# Patient Record
Sex: Male | Born: 1975 | Race: White | Hispanic: No | Marital: Married | Smoking: Former smoker
Health system: Southern US, Community
[De-identification: ages and names within clinical notes are randomized; demographics above are authoritative.]

## PROBLEM LIST (undated history)

## (undated) DIAGNOSIS — K219 Gastro-esophageal reflux disease without esophagitis: Secondary | ICD-10-CM

## (undated) DIAGNOSIS — M545 Low back pain, unspecified: Secondary | ICD-10-CM

## (undated) DIAGNOSIS — T7840XA Allergy, unspecified, initial encounter: Secondary | ICD-10-CM

## (undated) HISTORY — PX: WISDOM TOOTH EXTRACTION: SHX21

## (undated) HISTORY — DX: Low back pain, unspecified: M54.50

## (undated) HISTORY — DX: Gastro-esophageal reflux disease without esophagitis: K21.9

## (undated) HISTORY — DX: Allergy, unspecified, initial encounter: T78.40XA

## (undated) HISTORY — DX: Low back pain: M54.5

---

## 2001-08-05 ENCOUNTER — Emergency Department (HOSPITAL_COMMUNITY): Admission: EM | Admit: 2001-08-05 | Discharge: 2001-08-05 | Payer: Self-pay | Admitting: *Deleted

## 2001-08-06 ENCOUNTER — Emergency Department (HOSPITAL_COMMUNITY): Admission: EM | Admit: 2001-08-06 | Discharge: 2001-08-06 | Payer: Self-pay | Admitting: Emergency Medicine

## 2001-08-08 ENCOUNTER — Encounter (HOSPITAL_COMMUNITY): Admission: RE | Admit: 2001-08-08 | Discharge: 2001-09-07 | Payer: Self-pay | Admitting: Orthopaedic Surgery

## 2009-03-16 ENCOUNTER — Ambulatory Visit: Payer: Self-pay | Admitting: Family Medicine

## 2009-06-11 ENCOUNTER — Ambulatory Visit: Payer: Self-pay | Admitting: Family Medicine

## 2010-08-10 ENCOUNTER — Telehealth: Payer: Self-pay | Admitting: Family Medicine

## 2010-08-10 NOTE — Telephone Encounter (Signed)
Need chart

## 2010-08-10 NOTE — Telephone Encounter (Signed)
Faxed refill authorization for omeprazole 20 mg #30 with 0 refills pt needs ov

## 2011-09-05 ENCOUNTER — Ambulatory Visit (INDEPENDENT_AMBULATORY_CARE_PROVIDER_SITE_OTHER): Payer: BC Managed Care – PPO | Admitting: Family Medicine

## 2011-09-05 ENCOUNTER — Encounter: Payer: Self-pay | Admitting: Family Medicine

## 2011-09-05 VITALS — BP 108/70 | HR 64 | Ht 70.0 in | Wt 177.0 lb

## 2011-09-05 DIAGNOSIS — M545 Low back pain, unspecified: Secondary | ICD-10-CM

## 2011-09-05 LAB — POCT URINALYSIS DIPSTICK
Bilirubin, UA: NEGATIVE
Blood, UA: NEGATIVE
Glucose, UA: NEGATIVE
Ketones, UA: NEGATIVE
Spec Grav, UA: 1.015

## 2011-09-05 MED ORDER — CYCLOBENZAPRINE HCL 10 MG PO TABS: 10.0000 mg | ORAL_TABLET | Freq: Three times a day (TID) | ORAL | Status: AC | PRN

## 2011-09-05 MED ORDER — HYDROCODONE-ACETAMINOPHEN 5-500 MG PO TABS: 1.0000 | ORAL_TABLET | Freq: Three times a day (TID) | ORAL | Status: AC | PRN

## 2011-09-05 NOTE — Patient Instructions (Addendum)
Heat/stretches. Continue ibuprofen 800 mg every 8 hours with food.  Restart prilosec if you get any stomach discomfort Use the hydrocodone only for severe pain. Causes sedation. Use the muscle relaxant (flexeril) mainly just at night due to sedation.  Get the x-ray (orders are in computer--go directly to [facility] at the [facility]) if you are having ongoing back pain.
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
## 2012-06-28 ENCOUNTER — Ambulatory Visit (INDEPENDENT_AMBULATORY_CARE_PROVIDER_SITE_OTHER): Payer: BC Managed Care – PPO | Admitting: Family Medicine

## 2012-06-28 ENCOUNTER — Ambulatory Visit: Payer: Self-pay | Admitting: Family Medicine

## 2012-06-28 ENCOUNTER — Encounter: Payer: Self-pay | Admitting: Family Medicine

## 2012-06-28 VITALS — BP 130/90 | HR 72 | Temp 98.3°F | Wt 172.0 lb

## 2012-06-28 DIAGNOSIS — S61219A Laceration without foreign body of unspecified finger without damage to nail, initial encounter: Secondary | ICD-10-CM

## 2012-06-28 DIAGNOSIS — K529 Noninfective gastroenteritis and colitis, unspecified: Secondary | ICD-10-CM

## 2012-06-28 DIAGNOSIS — J069 Acute upper respiratory infection, unspecified: Secondary | ICD-10-CM

## 2012-06-28 DIAGNOSIS — K5289 Other specified noninfective gastroenteritis and colitis: Secondary | ICD-10-CM

## 2012-06-28 DIAGNOSIS — S61209A Unspecified open wound of unspecified finger without damage to nail, initial encounter: Secondary | ICD-10-CM

## 2012-06-28 NOTE — Progress Notes (Signed)
  Subjective:    Patient ID: Evan Frey, male    DOB: March 14, 1976, 37 y.o.   MRN: [id number]  HPI He has multiple concerns today. He has a six-day history of sore throat, nasal congestion, cough,sneezing, fever and chills, PND, fatigue. His cough has become today. He also complains of loose stools 3 or 4 per day for the last 4 days . Someone else on the same trip that he was recently on developed diarrhea after eating the same [place] food .diarrhea is going away on its own.He is using Claritin and NyQuil with some relief of his symptoms. His symptoms are slowly getting better. Approximately 5 days ago he cut his right proximal index finger. He cleaned it and bandaged it and put superglue on it. His here for followup on this.his immunizations are up to date   Review of Systems     Objective:   Physical Exam Alert and in no distress. Tympanic membranes and canals are normal. Throat is clear. Tonsils are normal. Neck is supple without adenopathy or thyromegaly. Cardiac exam shows a regular sinus rhythm without murmurs or gallops. Lungs are clear to auscultation.  down exam not done. The laceration to the right index finger at the bases approximately 2 cm long. No erythema tenderness or warmth is noted. His immunizations are up-to-date      Assessment & Plan:  Acute URI  Acute gastroenteritis  Laceration of finger of right hand, initial encounter since he is getting better from the URI and gastroenteritis, no therapy is necessary. The laceration is also healing well. No further intervention needed.

## 2012-10-12 ENCOUNTER — Encounter: Payer: Self-pay | Admitting: Internal Medicine

## 2012-10-24 ENCOUNTER — Encounter: Payer: Self-pay | Admitting: Medical

## 2012-10-24 ENCOUNTER — Ambulatory Visit (INDEPENDENT_AMBULATORY_CARE_PROVIDER_SITE_OTHER): Payer: BC Managed Care – PPO | Admitting: Medical

## 2012-10-24 VITALS — BP 100/70 | HR 68 | Temp 98.4°F | Resp 16 | Ht 70.0 in | Wt 173.0 lb

## 2012-10-24 DIAGNOSIS — R209 Unspecified disturbances of skin sensation: Secondary | ICD-10-CM

## 2012-10-24 DIAGNOSIS — R002 Palpitations: Secondary | ICD-10-CM

## 2012-10-24 DIAGNOSIS — R202 Paresthesia of skin: Secondary | ICD-10-CM

## 2012-10-24 DIAGNOSIS — R5381 Other malaise: Secondary | ICD-10-CM

## 2012-10-24 DIAGNOSIS — R5383 Other fatigue: Secondary | ICD-10-CM

## 2012-10-24 DIAGNOSIS — Z0000 Encounter for general adult medical examination without abnormal findings: Secondary | ICD-10-CM

## 2012-10-24 LAB — CBC WITH DIFFERENTIAL/PLATELET
Basophils Relative: 0 % (ref 0–1)
Eosinophils Absolute: 0.1 10*3/uL (ref 0.0–0.7)
Lymphs Abs: 2.4 10*3/uL (ref 0.7–4.0)
MCH: 30.8 pg (ref 26.0–34.0)
MCHC: 35 g/dL (ref 30.0–36.0)
Neutrophils Relative %: 54 % (ref 43–77)
Platelets: 221 10*3/uL (ref 150–400)
RBC: 4.96 MIL/uL (ref 4.22–5.81)

## 2012-10-24 LAB — POCT URINALYSIS DIPSTICK
Bilirubin, UA: NEGATIVE
Glucose, UA: NEGATIVE
Ketones, UA: NEGATIVE
Spec Grav, UA: 1.015
Urobilinogen, UA: NEGATIVE

## 2012-10-24 MED ORDER — OMEPRAZOLE MAGNESIUM 20 MG PO TBEC: 20.0000 mg | DELAYED_RELEASE_TABLET | Freq: Every day | ORAL | Status: DC | PRN

## 2012-10-24 NOTE — Progress Notes (Signed)
Subjective:   HPI  Evan Frey is a 37 y.o. male who presents for a complete physical.  Medical care team includes:  Chiropractor, Dr. Ladona Ridgel in [place]  Dentist - Dr. Yetta Barre  Primary care here, [facility]   Preventative care: Last ophthalmology visit: n/a Last dental visit:yes- Dr. Yetta Barre Last colonoscopy:n/a Last prostate exam: n/a Last EKG:n/a Last labs: 2011  Prior vaccinations: TD or Tdap:3/ 2011 Influenza:NEVER Shingles: n.a Pneumococcal:N/a  Advanced directive:n/a Health care power of attorney:n/a Living will:n/a  Concerns: Occasionally gets palpitations.  Has happened half a dozen times in his life, 3 times recently, short lived for a few seconds, thinks its due to increased stress with his work of recent.   No syncope, no SOB, no edema, no CP.   Thinks his pulse rate seems high on treadmill at times.    occsaionall gets numbness in hands when lying down, when arms are on his chest.   Only happens when asleep.  Despite exercising, feels overly fatigue of late.  Wakes feeling rested most of the time.  At times does feel excessively sleepy during the day.  No witnessed apnea.   Gets about 10 hours of sleep nightly.  Denies depression.    Reviewed their medical, surgical, family, social, medication, and allergy history and updated chart as appropriate.   Past Medical History  Diagnosis Date  . GERD (gastroesophageal reflux disease)   . Low back pain     lumbar, intermittent pain, sees chiropractor from time to time    Past Surgical History  Procedure Laterality Date  . Wisdom tooth extraction      Family History  Problem Relation Age of Onset  . Depression Mother   . Arrhythmia Mother   . Other Father     healthy, lives in [place]  . Cancer Maternal Aunt     breast  . Cancer Paternal Grandmother   . Heart disease Neg Hx   . Diabetes Neg Hx   . Stroke Neg Hx   . Hypertension Neg Hx   . Hyperlipidemia Neg Hx     History    Social History  . Marital Status: Married    Spouse Name: N/A    Number of Children: N/A  . Years of Education: N/A   Occupational History  . IT    Social History Main Topics  . Smoking status: Former Smoker    Quit date: 03/21/2006  . Smokeless tobacco: Never Used  . Alcohol Use: 8.4 oz/week    14 Cans of beer per week     Comment: 2-3 beers per week.  . Drug Use: No  . Sexually Active: Not on file   Other Topics Concern  . 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 ibuprofen (ADVIL,MOTRIN) 200 MG tablet Take 600 mg by mouth every 6 (six) hours as needed.      Marland Kitchen omeprazole (PRILOSEC OTC) 20 MG tablet Take 20 mg by mouth daily as needed.       No current facility-administered medications on file prior to visit.    No Known Allergies   Review of Systems Constitutional: -fever, -chills, -sweats, -unexpected weight change, -decreased appetite, +fatigue Allergy: -sneezing, -itching, -congestion Dermatology: -changing moles, --rash, -lumps ENT: -runny nose, -ear pain, -  sore throat, -hoarseness, -sinus pain, -teeth pain, - ringing in ears, -hearing loss, -nosebleeds Cardiology: -chest pain, -palpitations, -swelling, -difficulty breathing when lying flat, -waking up short of breath Respiratory: -cough, -shortness of breath, -difficulty breathing with exercise or exertion, -wheezing, -coughing up blood Gastroenterology: -abdominal pain, -nausea, -vomiting, -diarrhea, -constipation, -blood in stool, -changes in bowel movement, -difficulty swallowing or eating Hematology: -bleeding, -bruising  Musculoskeletal: -joint aches, -muscle aches, -joint swelling, +back pain, -neck pain, -cramping, -changes in gait Ophthalmology: denies vision changes, eye redness,  itching, discharge Urology: -burning with urination, -difficulty urinating, -blood in urine, -urinary frequency, -urgency, -incontinence Neurology: -headache, -weakness, -tingling, +numbness, -memory loss, -falls, -dizziness Psychology: -depressed mood, -agitation, -sleep problems     Objective:   Physical Exam  Nurse notes and vitals reviewed  General appearance: alert, no distress, WD/WN, lean white male Skin:no worrisome lesions HEENT: normocephalic, conjunctiva/corneas normal, sclerae anicteric, PERRLA, EOMi, nares patent, no discharge or erythema, pharynx normal Oral cavity: MMM, tongue normal, teeth normal Neck: supple, no lymphadenopathy, no thyromegaly, no masses, normal ROM, no bruits Chest: non tender, normal shape and expansion Heart: RRR, normal S1, S2, no murmurs Lungs: CTA bilaterally, no wheezes, rhonchi, or rales Abdomen: +bs, soft, non tender, non distended, no masses, no hepatomegaly, no splenomegaly, no bruits Back: non tender, normal ROM, no scoliosis Musculoskeletal: upper extremities non tender, no obvious deformity, normal ROM throughout, lower extremities non tender, no obvious deformity, normal ROM throughout Extremities: no edema, no cyanosis, no clubbing Pulses: 2+ symmetric, upper and lower extremities, normal cap refill Neurological: alert, oriented x 3, CN2-12 intact, strength normal upper extremities and lower extremities, sensation normal throughout, DTRs 2+ throughout, no cerebellar signs, gait normal, negative tinels and phalens Psychiatric: normal affect, behavior normal, pleasant  GU: normal male external genitalia, circumcised, nontender, no masses, no obvious hernia, no lymphadenopathy Rectal: deferred   Adult ECG Report  Indication: palpitations  Rate: 56 bpm  Rhythm: sinus bradycardia  QRS Axis: 32 degrees  PR Interval:  QRS Duration: 94ms  QTc:  Conduction Disturbances: none  Other Abnormalities: none  Patient's cardiac risk  factors are: none.  EKG comparison: none  Narrative Interpretation: sinus bradycardia, otherwise normal     Assessment and Plan :    Encounter Diagnoses  Name Primary?  . Routine general medical examination at a health care facility Yes  . Paresthesia   . Other malaise and fatigue   . Palpitations     Physical exam - discussed healthy lifestyle, diet, exercise, preventative care, vaccinations, and addressed their concerns.    Paresthesias - given his activities, working on computer daily, hand drumming, mountain biking, he may have very mild CTS.   advised to use support cushion at wrist for ergonomic improvements while using computer.   If this progressively gets worse, consider night time wrist splints.   Fatigue - labs today.  Fatigue likely due to his schedule and daily routine .  He will have wife observe for apnea spells  palpitations - discussed concerns, EKG.  Likely stress related or PVCs.  He will return if worsening or more frequent symptoms.   Follow-up pending labs

## 2012-10-24 NOTE — Addendum Note (Signed)
Addended by: Janeice Robinson on: 10/24/2012 09:53 AM   Modules accepted: Orders

## 2012-10-25 ENCOUNTER — Telehealth: Payer: Self-pay | Admitting: Medical

## 2012-10-25 LAB — COMPREHENSIVE METABOLIC PANEL
Albumin: 4.6 g/dL (ref 3.5–5.2)
Alkaline Phosphatase: 48 U/L (ref 39–117)
CO2: 27 mEq/L (ref 19–32)
Chloride: 102 mEq/L (ref 96–112)
Glucose, Bld: 97 mg/dL (ref 70–99)
Potassium: 4.1 mEq/L (ref 3.5–5.3)
Sodium: 138 mEq/L (ref 135–145)
Total Protein: 7.2 g/dL (ref 6.0–8.3)

## 2012-10-25 LAB — TSH: TSH: 1.299 u[IU]/mL (ref 0.350–4.500)

## 2012-10-25 LAB — LIPID PANEL
LDL Cholesterol: 115 mg/dL — ABNORMAL HIGH (ref 0–99)
VLDL: 38 mg/dL (ref 0–40)

## 2012-10-25 LAB — VITAMIN B12: Vitamin B-12: 631 pg/mL (ref 211–911)

## 2012-10-25 NOTE — Telephone Encounter (Signed)
Refill request from [facility]    Original Rx Prilosec       Note states patient requesting Rx Omeprazole instead of OTC

## 2012-10-26 ENCOUNTER — Other Ambulatory Visit: Payer: Self-pay | Admitting: Medical

## 2012-10-26 MED ORDER — OMEPRAZOLE 40 MG PO CPDR: DELAYED_RELEASE_CAPSULE | ORAL | Status: DC

## 2012-10-26 NOTE — Addendum Note (Signed)
Addended by: Jac Canavan on: 10/26/2012 08:09 AM   Modules accepted: Orders

## 2013-11-06 ENCOUNTER — Telehealth: Payer: Self-pay | Admitting: Family Medicine

## 2013-11-06 ENCOUNTER — Encounter: Payer: Self-pay | Admitting: Medical

## 2013-11-06 ENCOUNTER — Other Ambulatory Visit: Payer: Self-pay | Admitting: Medical

## 2013-11-06 ENCOUNTER — Ambulatory Visit (INDEPENDENT_AMBULATORY_CARE_PROVIDER_SITE_OTHER): Payer: BC Managed Care – PPO | Admitting: Medical

## 2013-11-06 VITALS — BP 116/78 | HR 64 | Temp 98.1°F | Resp 16 | Ht 70.0 in | Wt 174.0 lb

## 2013-11-06 DIAGNOSIS — R143 Flatulence: Secondary | ICD-10-CM

## 2013-11-06 DIAGNOSIS — R5381 Other malaise: Secondary | ICD-10-CM

## 2013-11-06 DIAGNOSIS — R142 Eructation: Secondary | ICD-10-CM

## 2013-11-06 DIAGNOSIS — R002 Palpitations: Secondary | ICD-10-CM

## 2013-11-06 DIAGNOSIS — R7989 Other specified abnormal findings of blood chemistry: Secondary | ICD-10-CM

## 2013-11-06 DIAGNOSIS — R140 Abdominal distension (gaseous): Secondary | ICD-10-CM

## 2013-11-06 DIAGNOSIS — E291 Testicular hypofunction: Secondary | ICD-10-CM

## 2013-11-06 DIAGNOSIS — R5383 Other fatigue: Secondary | ICD-10-CM

## 2013-11-06 DIAGNOSIS — Z0000 Encounter for general adult medical examination without abnormal findings: Secondary | ICD-10-CM

## 2013-11-06 DIAGNOSIS — R079 Chest pain, unspecified: Secondary | ICD-10-CM

## 2013-11-06 DIAGNOSIS — R141 Gas pain: Secondary | ICD-10-CM

## 2013-11-06 LAB — CBC
HCT: 43.9 % (ref 39.0–52.0)
Hemoglobin: 15.5 g/dL (ref 13.0–17.0)
MCH: 30.5 pg (ref 26.0–34.0)
MCHC: 35.3 g/dL (ref 30.0–36.0)
MCV: 86.2 fL (ref 78.0–100.0)
PLATELETS: 218 10*3/uL (ref 150–400)
RBC: 5.09 MIL/uL (ref 4.22–5.81)
RDW: 13.3 % (ref 11.5–15.5)
WBC: 6.6 10*3/uL (ref 4.0–10.5)

## 2013-11-06 LAB — POCT URINALYSIS DIPSTICK
Bilirubin, UA: NEGATIVE
Blood, UA: NEGATIVE
GLUCOSE UA: NEGATIVE
Ketones, UA: NEGATIVE
Leukocytes, UA: NEGATIVE
Nitrite, UA: NEGATIVE
Protein, UA: NEGATIVE
SPEC GRAV UA: 1.01
UROBILINOGEN UA: NEGATIVE
pH, UA: 6

## 2013-11-06 MED ORDER — OMEPRAZOLE 40 MG PO CPDR: DELAYED_RELEASE_CAPSULE | ORAL | Status: DC

## 2013-11-06 NOTE — Progress Notes (Signed)
Subjective:   HPI  Evan SermonCharles M Frey is a 38 y.o. male who presents for a complete physical.  Preventative care: Last ophthalmology visit: decade ago Last dental visit: every 6 months  Concerns: Takes Prilosec occasionally, has hx/o GERD.  Always feels bloated, burps a lot.  Has had these symptoms for over a year.  Does drink 1-2 alcoholic drinks daily.  Denies weight loss, stool changes, no bleeidng or bruising concerns.  Takes ibuprofen few times per week, usually 600mg  at a time.  Eats spicy and acidic foods.    Has had some chest pains recently.  Mom and brother have hx/o heart arrhythmia.  Gets palpitations occasionally.  Chest pains last for minute less, can happen any time, not usually with exercise.   Goes intense with exercise and no chest pain at all.    Feels fatigue a lot, had low testosterone last year, wants to recheck this.  Thinks he has less energy.   Denies ED issues, but libido a little down.  Reviewed their medical, surgical, family, social, medication, and allergy history and updated chart as appropriate.  Past Medical History  Diagnosis Date  . GERD (gastroesophageal reflux disease)   . Low back pain     lumbar, intermittent pain, sees chiropractor from time to time    Past Surgical History  Procedure Laterality Date  . Wisdom tooth extraction      History   Social History  . Marital Status: Married    Spouse Name: N/A    Number of Children: N/A  . Years of Education: N/A   Occupational History  . IT    Social History Main Topics  . Smoking status: Former Smoker    Quit date: 03/21/2006  . Smokeless tobacco: Never Used  . Alcohol Use: 8.4 oz/week    14 Cans of beer per week     Comment: 2-3 beers per week.  . Drug Use: No  . Sexual Activity: Not on file   Other Topics Concern  . 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 Depression Mother   . Arrhythmia Mother   . Other Father     healthy, lives in [place]  . Cancer Maternal Aunt     breast  . Cancer Paternal Grandmother   . Heart disease Neg Hx   . Diabetes Neg Hx   . Stroke Neg Hx   . Hypertension Neg Hx   . Hyperlipidemia Neg Hx     Current outpatient prescriptions:ibuprofen (ADVIL,MOTRIN) 200 MG tablet, Take 600 mg by mouth every 6 (six) hours as needed., Disp: , Rfl: ;  omeprazole (PRILOSEC) 40 MG capsule, 1 tablet po 45 min before breakfast daily, Disp: 30 capsule, Rfl: 2  No Known Allergies    Review of Systems Constitutional: -fever, -chills, -sweats, -unexpected weight change, -decreased appetite, -fatigue Allergy: -sneezing, -itching, -congestion Dermatology: -changing moles, --rash, -lumps ENT: -runny nose, -ear pain, -sore throat, -hoarseness, -sinus pain, -teeth pain, - ringing in ears, -hearing loss, -nosebleeds Cardiology: +chest pain(3 in the last month- does not last long), -palpitations, -swelling, -difficulty breathing when lying flat, -waking up short of breath Respiratory: -cough, -shortness of breath, -difficulty breathing with exercise or exertion, -wheezing, -coughing up blood Gastroenterology: -abdominal pain, -nausea, -vomiting, -diarrhea, -constipation, -blood in stool, -changes in bowel  movement, -difficulty swallowing or eating Hematology: -bleeding, -bruising  Musculoskeletal: -joint aches, -muscle aches, -joint swelling, +back pain(chronic), -neck pain, -cramping, -changes in gait Ophthalmology: denies vision changes, eye redness, itching, discharge Urology: -burning with urination, -difficulty urinating, -blood in urine, -urinary frequency, -urgency, -incontinence Neurology: -headache, -weakness, -tingling, -numbness, -memory loss, -falls, -dizziness Psychology: -depressed mood, -agitation, -sleep problems     Objective:   Physical Exam  BP  116/78  Pulse 64  Temp(Src) 98.1 F (36.7 C) (Oral)  Resp 16  Ht 5\' 10"  (1.778 m)  Wt 174 lb (78.926 kg)  BMI 24.97 kg/m2  General appearance: alert, no distress, WD/WN, lean white male Skin: few scattered benign appearing macules, no worrisome lesions HEENT: normocephalic, conjunctiva/corneas normal, sclerae anicteric, PERRLA, EOMi, nares patent, no discharge or erythema, pharynx normal Oral cavity: MMM, tongue normal, teeth normal Neck: supple, no lymphadenopathy, no thyromegaly, no masses, normal ROM, no bruits Chest: non tender, normal shape and expansion Heart: RRR, normal S1, S2, no murmurs Lungs: CTA bilaterally, no wheezes, rhonchi, or rales Abdomen: +bs, soft, non tender, non distended, no masses, no hepatomegaly, no splenomegaly, no bruits Back: non tender, normal ROM, no scoliosis Musculoskeletal: upper extremities non tender, no obvious deformity, normal ROM throughout, lower extremities non tender, no obvious deformity, normal ROM throughout Extremities: no edema, no cyanosis, no clubbing Pulses: 2+ symmetric, upper and lower extremities, normal cap refill Neurological: alert, oriented x 3, CN2-12 intact, strength normal upper extremities and lower extremities, sensation normal throughout, DTRs 2+ throughout, no cerebellar signs, gait normal Psychiatric: normal affect, behavior normal, pleasant  GU: normal male external genitalia, circumcised, nontender, no masses, no hernia, no lymphadenopathy Rectal: deferred/declined   Assessment and Plan :    Encounter Diagnoses  Name Primary?  . Routine general medical examination at a health care facility Yes  . Bloating   . Belching   . Palpitations   . Chest pain, unspecified chest pain type   . Low testosterone   . Other malaise and fatigue     Physical exam - discussed healthy lifestyle, diet, exercise, preventative care, vaccinations, and addressed their concerns.    We discussed his symptoms and concerns. Advised to  cut back on caffeine and alcohol intake.    Specific recommendations today include:  we will refer you to gastroenterology for an endoscopy given the bloating, belching, and years worth of gastritis symptoms  I do recommend he limit alcohol intake  Trying use stretching and routine core strengthening exercise for back pains, avoid anti-inflammatories when possible  Start keeping a record or diary of your palpitations and chest pains. When you get the symptoms write down your pulse rate, how much caffeine you had that day, how much sleep you had the night before, what activities you were doing with the symptoms  If the symptoms persist despite cutting back on caffeine and we may need to do an event monitor or other cardiac testing  We will call lab results  Declines flu vaccine.  Follow-up pending labs.

## 2013-11-06 NOTE — Telephone Encounter (Signed)
Pt wants omeprazole 40 mg to [facility].

## 2013-11-06 NOTE — Patient Instructions (Signed)
  Thank you for giving me the opportunity to serve you today.    Your diagnosis today includes: Encounter Diagnoses  Name Primary?  . Routine general medical examination at a health care facility Yes  . Bloating   . Belching   . Palpitations   . Chest pain, unspecified chest pain type   . Low testosterone   . Other malaise and fatigue      Specific recommendations today include:  we will refer you to gastroenterology for an endoscopy given the bloating, belching, and years worth of gastritis symptoms  I do recommend he limit alcohol intake  Trying use stretching and routine core strengthening exercise for back pains, avoid anti-inflammatories when possible  Start keeping a record or diary of your palpitations and chest pains. When you get the symptoms write down your pulse rate, how much caffeine you had that day, how much sleep you had the night before, what activities you were doing with the symptoms  If the symptoms persist despite cutting back on caffeine and we may need to do an event monitor or other cardiac testing  We will call lab results  Return pending labs.

## 2013-11-07 LAB — PROLACTIN: PROLACTIN: 4 ng/mL (ref 2.1–17.1)

## 2013-11-07 LAB — TSH: TSH: 1.177 u[IU]/mL (ref 0.350–4.500)

## 2013-11-07 LAB — COMPREHENSIVE METABOLIC PANEL
ALK PHOS: 50 U/L (ref 39–117)
ALT: 30 U/L (ref 0–53)
AST: 21 U/L (ref 0–37)
Albumin: 4.8 g/dL (ref 3.5–5.2)
BILIRUBIN TOTAL: 1.6 mg/dL — AB (ref 0.2–1.2)
BUN: 14 mg/dL (ref 6–23)
CO2: 27 meq/L (ref 19–32)
CREATININE: 0.96 mg/dL (ref 0.50–1.35)
Calcium: 9.4 mg/dL (ref 8.4–10.5)
Chloride: 103 mEq/L (ref 96–112)
Glucose, Bld: 93 mg/dL (ref 70–99)
Potassium: 4.3 mEq/L (ref 3.5–5.3)
Sodium: 141 mEq/L (ref 135–145)
Total Protein: 7.2 g/dL (ref 6.0–8.3)

## 2013-11-07 LAB — FSH/LH
FSH: 5.3 m[IU]/mL (ref 1.4–18.1)
LH: 2 m[IU]/mL (ref 1.5–9.3)

## 2013-11-07 LAB — TESTOSTERONE: TESTOSTERONE: 516 ng/dL (ref 300–890)

## 2013-11-07 LAB — PSA: PSA: 1.16 ng/mL (ref <=4.00)

## 2013-11-07 NOTE — Addendum Note (Signed)
Addended by: Herminio CommonsJOHNSON, SABRINA A on: 11/07/2013 02:16 PM   Modules accepted: Orders

## 2013-11-12 ENCOUNTER — Encounter: Payer: Self-pay | Admitting: Family Medicine

## 2013-11-12 ENCOUNTER — Encounter: Payer: Self-pay | Admitting: Internal Medicine

## 2013-11-12 ENCOUNTER — Other Ambulatory Visit: Payer: Self-pay | Admitting: Family Medicine

## 2014-01-09 ENCOUNTER — Ambulatory Visit (INDEPENDENT_AMBULATORY_CARE_PROVIDER_SITE_OTHER): Payer: BC Managed Care – PPO | Admitting: Internal Medicine

## 2014-01-09 ENCOUNTER — Encounter: Payer: Self-pay | Admitting: Internal Medicine

## 2014-01-09 VITALS — BP 108/66 | HR 72 | Ht 70.0 in | Wt 179.1 lb

## 2014-01-09 DIAGNOSIS — R140 Abdominal distension (gaseous): Secondary | ICD-10-CM

## 2014-01-09 DIAGNOSIS — K219 Gastro-esophageal reflux disease without esophagitis: Secondary | ICD-10-CM | POA: Insufficient documentation

## 2014-01-09 DIAGNOSIS — R142 Eructation: Secondary | ICD-10-CM

## 2014-01-09 MED ORDER — OMEPRAZOLE 20 MG PO CPDR: 20.0000 mg | DELAYED_RELEASE_CAPSULE | Freq: Every day | ORAL | Status: DC

## 2014-01-09 NOTE — Assessment & Plan Note (Signed)
Seems benign Started before PPI Try gas reduction diet Lactose intolerance (he ?) would more likely cause flatulence and diarrhea

## 2014-01-09 NOTE — Assessment & Plan Note (Signed)
Chest pain gone on reg PPI so that indicates it is from GERD Stay on PPI - reduce to 20 mg after 2 months at 40 mg No need for EGD at this time - could consider in 50's

## 2014-01-09 NOTE — Assessment & Plan Note (Signed)
Seems benign Started before PPI Try gas reduction diet Lactose intolerance (he ?) would more likely cause flatulence and diarrhea 

## 2014-01-09 NOTE — Patient Instructions (Signed)
Today you have been given a gas handout to read.  Today you have been given a printed rx for prilosec to take to the pharmacy.  Take as directed.   I appreciate the opportunity to care for you.

## 2014-01-11 NOTE — Progress Notes (Signed)
   Subjective:    Patient ID: Evan Frey, male    DOB: 09-Jul-1975, 38 y.o.   MRN: [id number]  HPI Pleasant middle-aged man here at request of [name], PA-C regarding GERD. He has had years of intermittent heartburn with belching and bloating, took intermittent PPI with help. Also has had intermittent short-lived sharp chest pains. Saw Mr. [name] and has done well on regular PPI since then. No dysphagia. 1-2 caffeine beverages qd and no smoking/tobacco. No weight loss, bleeding or anemia. GI ROS o/w neg.  He has wondered if some sxs of bloating, are from lactose intolerance. Milk but not cheese restriction has not changed this.  Medications, allergies, past medical history, past surgical history, family history and social history are reviewed and updated in the EMR.   Review of Systems All other ROS negative    Objective:   Physical Exam General:  Well-developed, well-nourished and in no acute distress Eyes:  anicteric. ENT:   Mouth and posterior pharynx free of lesions.  Lungs: Clear to auscultation bilaterally. Heart:  S1S2, no rubs, murmurs, gallops. Abdomen:  soft, non-tender, no hepatosplenomegaly, hernia, or mass and BS+.  Neuro:  A&O x 3.  Psych:  appropriate mood and  Affect.   Data Reviewed: PCP notes, labs    Assessment & Plan:  Bloating Seems benign Started before PPI Try gas reduction diet Lactose intolerance (he ?) would more likely cause flatulence and diarrhea  GERD (gastroesophageal reflux disease) Chest pain gone on reg PPI so that indicates it is from GERD Stay on PPI - reduce to 20 mg after 2 months at 40 mg No need for EGD at this time - could consider in 50's  Belching Seems benign Started before PPI Try gas reduction diet Lactose intolerance (he ?) would more likely cause flatulence and diarrhea   I appreciate the opportunity to care for this patient. ON:[id number]:[name], DAVID SHANE, PA-C

## 2014-04-08 ENCOUNTER — Telehealth: Payer: Self-pay | Admitting: Internal Medicine

## 2014-04-08 MED ORDER — OMEPRAZOLE 20 MG PO CPDR: DELAYED_RELEASE_CAPSULE | ORAL | Status: DC

## 2014-04-08 NOTE — Telephone Encounter (Signed)
Spoke with patient , he never got his omeprazole filled after last visit in October.  Loss his rx so I sent new one thru the computer.

## 2014-06-02 ENCOUNTER — Ambulatory Visit (INDEPENDENT_AMBULATORY_CARE_PROVIDER_SITE_OTHER): Payer: BLUE CROSS/BLUE SHIELD | Admitting: Medical

## 2014-06-02 ENCOUNTER — Encounter: Payer: Self-pay | Admitting: Medical

## 2014-06-02 VITALS — BP 118/80 | HR 56 | Resp 16 | Wt 172.0 lb

## 2014-06-02 DIAGNOSIS — S8001XA Contusion of right knee, initial encounter: Secondary | ICD-10-CM

## 2014-06-02 DIAGNOSIS — S83411A Sprain of medial collateral ligament of right knee, initial encounter: Secondary | ICD-10-CM | POA: Diagnosis not present

## 2014-06-02 DIAGNOSIS — S80211A Abrasion, right knee, initial encounter: Secondary | ICD-10-CM | POA: Diagnosis not present

## 2014-06-02 DIAGNOSIS — S39011A Strain of muscle, fascia and tendon of abdomen, initial encounter: Secondary | ICD-10-CM
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
## 2014-07-10 ENCOUNTER — Telehealth: Payer: Self-pay | Admitting: Medical

## 2014-07-10 NOTE — Telephone Encounter (Signed)
Pt called and stated he is still having issues with his knee. He would like to get an xray or know what to do now. Please call pt at [phone] and [facility] uses [facility] on [address]

## 2014-07-10 NOTE — Telephone Encounter (Signed)
Given the prior visit findings, and if no improvement, lets refer to Dr. Janell QuietLucey/ortho for further eval

## 2014-07-15 NOTE — Telephone Encounter (Signed)
I left the patient a detailed voicemail. Patient has an appointment to see Dr. Sherlean Footlucey 07/18/14 @ 1040 am [address] [place], [place] [phone] Fax [phone] I fax over last OV notes to Dr. Sherlean FootLucey

## 2014-07-18 ENCOUNTER — Other Ambulatory Visit: Payer: Self-pay | Admitting: Orthopedic Surgery

## 2014-07-18 DIAGNOSIS — M25561 Pain in right knee: Secondary | ICD-10-CM

## 2014-07-18 DIAGNOSIS — R531 Weakness: Secondary | ICD-10-CM

## 2014-07-22 ENCOUNTER — Ambulatory Visit
Admission: RE | Admit: 2014-07-22 | Discharge: 2014-07-22 | Disposition: A | Discharge status: Home / Self Care | Payer: BLUE CROSS/BLUE SHIELD | Source: Ambulatory Visit | Attending: Orthopedic Surgery | Admitting: Orthopedic Surgery

## 2014-07-22 DIAGNOSIS — R531 Weakness: Secondary | ICD-10-CM

## 2014-07-22 DIAGNOSIS — M25561 Pain in right knee: Secondary | ICD-10-CM

## 2015-05-15 ENCOUNTER — Other Ambulatory Visit: Payer: Self-pay | Admitting: Internal Medicine

## 2015-05-21 ENCOUNTER — Ambulatory Visit (INDEPENDENT_AMBULATORY_CARE_PROVIDER_SITE_OTHER): Payer: BLUE CROSS/BLUE SHIELD | Admitting: Family Medicine

## 2015-05-21 ENCOUNTER — Encounter: Payer: Self-pay | Admitting: Family Medicine

## 2015-05-21 VITALS — BP 130/80 | HR 60 | Temp 98.4°F | Wt 179.2 lb

## 2015-05-21 DIAGNOSIS — R6889 Other general symptoms and signs: Secondary | ICD-10-CM

## 2015-05-21 DIAGNOSIS — J069 Acute upper respiratory infection, unspecified: Secondary | ICD-10-CM

## 2015-05-21 NOTE — Progress Notes (Signed)
   Subjective:    Patient ID: Evan Frey, male    DOB: [birth year], 40 y.o.   MRN: [id number]  HPI Chief Complaint  Patient presents with  . flu    flu like symptoms- running nose, fever- 100.5, fatigue, sneezing, chills   He is here with complaints of a 2 day history of flu like symptoms including runny nose, sneezing, chills, low grade fever, fatigue, and dry cough. He reports feeling some better than when his symptoms started. Denies ear pain and sore throat.  Denies smoking, no recent antibiotics. Denies  underlying allergies. He did not get flu shot this year.   Review of Systems Pertinent positives and negatives in the history of present illness.     Objective:   Physical Exam BP 130/80 mmHg  Pulse 60  Temp(Src) 98.4 F (36.9 C) (Oral)  Wt 179 lb 3.2 oz (81.285 kg)  Alert and in no distress. No sinus tenderness. Nares patent with erythema and mild edema, left greater than right. Tympanic membranes and canals are normal. Pharyngeal area is erythematous without edema or exudate. Neck is supple without adenopathy or thyromegaly. Cardiac exam shows a regular sinus rhythm without murmurs or gallops. Lungs are clear to auscultation.  Flu swab negative.      Assessment & Plan:  Acute URI  Flu-like symptoms  Discussed that his symptoms appear to be related to a viral etiology. Recommend symptomatic treatment and call/return if not improving in the next 3-4 days. Discussed staying well hydrated, taking Tylenol or ibuprofen, using salt water gargles.

## 2015-05-21 NOTE — Patient Instructions (Signed)
Recommend symptomatic treatment. Salt water gargles for sore throat. Claritin or Benadryl for sneezing and runny nose. Tylenol or ibuprofen for headache or body ache or fever. Stay well-hydrated and let me know if you're not improving in the next 3-4 days.

## 2015-05-26 LAB — POC INFLUENZA A&B (BINAX/QUICKVUE)
INFLUENZA A, POC: NEGATIVE
INFLUENZA B, POC: NEGATIVE

## 2015-05-26 NOTE — Addendum Note (Signed)
Addended by: Herminio CommonsJOHNSON, Heberto Sturdevant A on: 05/26/2015 03:33 PM   Modules accepted: Orders

## 2015-11-10 ENCOUNTER — Other Ambulatory Visit: Payer: Self-pay | Admitting: Internal Medicine

## 2015-11-10 NOTE — Telephone Encounter (Signed)
Is it ok to refill this medication?  patient has not been seen in about 2 years. Does he need a follow up?

## 2015-11-10 NOTE — Telephone Encounter (Signed)
Ask the patient to request refill from PCP since he was seen there in 2016 and 2017. He does not need to see me for PPI refills. While he waits he can get OTC omeprazole 20 mg and take qd

## 2015-11-11 NOTE — Telephone Encounter (Signed)
Patient states he is getting his Rx from his pcp at this time.

## 2015-12-18 ENCOUNTER — Encounter: Payer: Self-pay | Admitting: Family Medicine

## 2015-12-18 ENCOUNTER — Ambulatory Visit (INDEPENDENT_AMBULATORY_CARE_PROVIDER_SITE_OTHER): Payer: BLUE CROSS/BLUE SHIELD | Admitting: Family Medicine

## 2015-12-18 VITALS — BP 110/64 | HR 60 | Temp 98.0°F | Wt 181.2 lb

## 2015-12-18 DIAGNOSIS — M6248 Contracture of muscle, other site: Secondary | ICD-10-CM | POA: Diagnosis not present

## 2015-12-18 DIAGNOSIS — M62838 Other muscle spasm: Secondary | ICD-10-CM

## 2015-12-18 MED ORDER — CYCLOBENZAPRINE HCL 10 MG PO TABS: 10.0000 mg | ORAL_TABLET | Freq: Every day | ORAL | 0 refills | Status: DC

## 2015-12-18 NOTE — Patient Instructions (Signed)
Use heat to the area, 2 Aleve twice daily with food for the next 3-4 days. Use the muscle relaxant at bedtime if needed but be aware this can cause drowsiness. Do not drink alcohol or drive with the medication.  Try to use good posture.  If this continues then I recommend you follow up with Vincenza HewsShane, PCP for further evaluation.   Acute Torticollis Torticollis is a condition in which the muscles of the neck tighten (contract) abnormally, causing the neck to twist and the head to move into an unnatural position. Torticollis that develops suddenly is called acute torticollis. If torticollis becomes chronic and is left untreated, the face and neck can become deformed. CAUSES This condition may be caused by:  Sleeping in an awkward position (common).  Extending or twisting the neck muscles beyond their normal position.  Infection. In some cases, the cause may not be known. SYMPTOMS Symptoms of this condition include:  An unnatural position of the head.  Neck pain.  A limited ability to move the neck.  Twisting of the neck to one side. DIAGNOSIS This condition is diagnosed with a physical exam. You may also have imaging tests, such as an X-ray, CT scan, or MRI. TREATMENT Treatment for this condition involves trying to relax the neck muscles. It may include:  Medicines or shots.  Physical therapy.  Surgery. This may be done in severe cases. HOME CARE INSTRUCTIONS  Take medicines only as directed by your health care provider.  Do stretching exercises and massage your neck as directed by your health care provider.  Keep all follow-up visits as directed by your health care provider. This is important. SEEK MEDICAL CARE IF:  You develop a fever. SEEK IMMEDIATE MEDICAL CARE IF:  You develop difficulty breathing.  You develop noisy breathing (stridor).  You start drooling.  You have trouble swallowing or have pain with swallowing.  You develop numbness or weakness in your hands  or feet.  You have changes in your speech, understanding, or vision.  Your pain gets worse.   This information is not intended to replace advice given to you by your health care provider. Make sure you discuss any questions you have with your health care provider.   Document Released: 03/04/2000 Document Revised: 07/22/2014 Document Reviewed: 03/03/2014 Elsevier Interactive Patient Education [employer]2016 Elsevier Inc.

## 2015-12-18 NOTE — Progress Notes (Signed)
   Subjective:    Patient ID: Evan Frey, male    DOB: May 04, 1975, 40 y.o.   MRN: [id number]  HPI Chief Complaint  Patient presents with  . neck pain    slept on neck wrong and having pain for the last 3 days   He is here with complaints of right sided neck tightness and pain for the past 3 days. Pain is constant and worse with movement. Non radiating.   History of similar neck pain and sees chiropractor usually. He was not able to see the chiropractor this time.  Has tried aleve, ibuprofen, heat and ice without any relief.  Has tried muscle relaxants in the past with success but does not have any at home.   Denies fever, chills, headache, vision changes, numbness, tingling , weakness.   Past Medical History:  Diagnosis Date  . GERD (gastroesophageal reflux disease)   . Low back pain    lumbar, intermittent pain, sees chiropractor from time to time     Review of Systems Pertinent positives and negatives in the history of present illness.     Objective:   Physical Exam  Constitutional: He is oriented to person, place, and time. He appears well-developed and well-nourished. No distress.  Neck: Neck supple. Muscular tenderness present. No spinous process tenderness present. No erythema and normal range of motion present.  Right trapezius with spasm, no erythema, full passive ROM with pain.   Musculoskeletal:       Right shoulder: Normal.       Cervical back: He exhibits spasm.       Back:  Neurological: He is alert and oriented to person, place, and time. He has normal strength. No sensory deficit.  Skin: Skin is warm and dry. No rash noted. No pallor.   BP 110/64   Pulse 60   Temp 98 F (36.7 C) (Oral)   Wt 181 lb 3.2 oz (82.2 kg)   BMI 26.00 kg/m      Assessment & Plan:  Neck muscle spasm - Plan: cyclobenzaprine (FLEXERIL) 10 MG tablet  Discussed conservative treatment such as heat, NSAIDs, and flexeril as needed at bedtime. Cautioned against driving or  drinking alcohol with medication and advised of it's sedating effects. He will follow up if not improving. May see chiropractor if he wishes.

## 2016-06-21 ENCOUNTER — Encounter: Payer: Self-pay | Admitting: Family Medicine

## 2016-06-21 ENCOUNTER — Ambulatory Visit (INDEPENDENT_AMBULATORY_CARE_PROVIDER_SITE_OTHER): Payer: BLUE CROSS/BLUE SHIELD | Admitting: Family Medicine

## 2016-06-21 VITALS — BP 120/78 | HR 66 | Wt 186.0 lb

## 2016-06-21 DIAGNOSIS — R0789 Other chest pain: Secondary | ICD-10-CM | POA: Diagnosis not present

## 2016-06-21 NOTE — Progress Notes (Signed)
   Subjective:    Patient ID: Evan Frey, male    DOB: 08-03-1975, 41 y.o.   MRN: [id number]  HPI Chief Complaint  Patient presents with  . chest tightness    chest tightness, no SOB, started this morning, tightness on right side   He is here with complaints of intermittent right sided chest tightness that started after waking up this morning. Pain is associated with movement and deep inspiration. Pain is non radiating, not improved or made worse with leaning forward. Denies leg or calf pain.   States he took an Catering manager and went back to bed for an hour and the pain is at least 80% better. States he almost called to cancel his appointment because the problem has basically resolved.   States he was spreading lime on his yard yesterday and thinks this may have triggered his pain. No known injury. No recent illness.   Denies fever, chills, headache, dizziness, palpitations, cough, shortness of breath, abdominal pain, back pain, N/V/D.   Denies personal or family history of heart disease.   Reviewed allergies, medications, past medical, surgical, family, and social history.    Review of Systems Pertinent positives and negatives in the history of present illness.     Objective:   Physical Exam  Constitutional: He is oriented to person, place, and time. He appears well-developed and well-nourished. No distress.  Eyes: Conjunctivae are normal. Pupils are equal, round, and reactive to light.  Neck: Normal range of motion. Neck supple.  Cardiovascular: Normal rate, regular rhythm, normal heart sounds and intact distal pulses.  Exam reveals no gallop and no friction rub.   No murmur heard. No LE edema  Pulmonary/Chest: Effort normal and breath sounds normal. He exhibits no tenderness, no edema and no deformity.  Musculoskeletal: Normal range of motion. He exhibits no edema or tenderness.  Lymphadenopathy:    He has no cervical adenopathy.  Neurological: He is alert and oriented  to person, place, and time.  Skin: Skin is warm and dry. No rash noted. He is not diaphoretic. No pallor.  Psychiatric: He has a normal mood and affect. His speech is normal and behavior is normal. Thought content normal.   BP 120/78   Pulse 66   Wt 186 lb (84.4 kg)   SpO2 99%   BMI 26.69 kg/m       Assessment & Plan:  Chest wall pain  Discussed that his symptoms have mostly resolved and appear to be related to a musculoskeletal etiology and not cardiac or pulmonary. Pain with movement speaks to this and he does not appear to be in any distress. Offered ECG but patient declines and I am ok with this. Discussed that if his symptoms worsen or if any new symptoms arise that he should call or return. He may try an anti-inflammatory.  Follow up as needed.  Recommended that he return for a CPE and fasting labs with his PCP Vincenza Hews in the next few weeks.

## 2016-07-28 ENCOUNTER — Telehealth: Payer: Self-pay

## 2016-07-28 NOTE — Telephone Encounter (Signed)
Let schedule him CPX.  I last saw him for routine visit >1 year ago.  I realize he saw Vickie recently for acute issue.

## 2016-07-28 NOTE — Telephone Encounter (Signed)
Pt called in requesting refills of cyclobenzaprine, and omeprazole. Please call into [facility] [address]   CB [phone].Trixie Rude/RLB

## 2016-08-01 ENCOUNTER — Other Ambulatory Visit: Payer: Self-pay | Admitting: Medical

## 2016-08-01 DIAGNOSIS — M62838 Other muscle spasm: Secondary | ICD-10-CM

## 2016-08-01 MED ORDER — CYCLOBENZAPRINE HCL 10 MG PO TABS: 10.0000 mg | ORAL_TABLET | Freq: Every day | ORAL | 0 refills | Status: DC

## 2016-08-01 NOTE — Telephone Encounter (Signed)
Pt scheduled CPE for 06/04, please renew meds.

## 2016-08-01 NOTE — Telephone Encounter (Signed)
I refilled Flexeril

## 2016-08-22 ENCOUNTER — Ambulatory Visit (INDEPENDENT_AMBULATORY_CARE_PROVIDER_SITE_OTHER): Payer: BLUE CROSS/BLUE SHIELD | Admitting: Medical

## 2016-08-22 ENCOUNTER — Encounter: Payer: Self-pay | Admitting: Medical

## 2016-08-22 ENCOUNTER — Other Ambulatory Visit: Payer: Self-pay | Admitting: Medical

## 2016-08-22 VITALS — BP 110/82 | HR 60 | Ht 70.0 in | Wt 176.8 lb

## 2016-08-22 DIAGNOSIS — M542 Cervicalgia: Secondary | ICD-10-CM | POA: Diagnosis not present

## 2016-08-22 DIAGNOSIS — K219 Gastro-esophageal reflux disease without esophagitis: Secondary | ICD-10-CM | POA: Diagnosis not present

## 2016-08-22 DIAGNOSIS — R140 Abdominal distension (gaseous): Secondary | ICD-10-CM | POA: Diagnosis not present

## 2016-08-22 DIAGNOSIS — R200 Anesthesia of skin: Secondary | ICD-10-CM | POA: Insufficient documentation

## 2016-08-22 DIAGNOSIS — R55 Syncope and collapse: Secondary | ICD-10-CM

## 2016-08-22 DIAGNOSIS — R195 Other fecal abnormalities: Secondary | ICD-10-CM | POA: Insufficient documentation

## 2016-08-22 DIAGNOSIS — R142 Eructation: Secondary | ICD-10-CM | POA: Diagnosis not present

## 2016-08-22 DIAGNOSIS — R109 Unspecified abdominal pain: Secondary | ICD-10-CM | POA: Insufficient documentation

## 2016-08-22 DIAGNOSIS — Z0000 Encounter for general adult medical examination without abnormal findings: Secondary | ICD-10-CM | POA: Diagnosis not present

## 2016-08-22 LAB — POCT URINALYSIS DIPSTICK
Bilirubin, UA: NEGATIVE
Glucose, UA: NEGATIVE
Ketones, UA: NEGATIVE
Leukocytes, UA: NEGATIVE
Nitrite, UA: NEGATIVE
Protein, UA: NEGATIVE
RBC UA: NEGATIVE
UROBILINOGEN UA: NEGATIVE U/dL — AB
pH, UA: 6 (ref 5.0–8.0)

## 2016-08-22 LAB — CBC
HCT: 46.1 % (ref 38.5–50.0)
HEMOGLOBIN: 15.8 g/dL (ref 13.2–17.1)
MCH: 30.2 pg (ref 27.0–33.0)
MCHC: 34.3 g/dL (ref 32.0–36.0)
MCV: 88.1 fL (ref 80.0–100.0)
MPV: 8.9 fL (ref 7.5–12.5)
PLATELETS: 246 10*3/uL (ref 140–400)
RBC: 5.23 MIL/uL (ref 4.20–5.80)
RDW: 13.2 % (ref 11.0–15.0)
WBC: 6.5 10*3/uL (ref 4.0–10.5)

## 2016-08-22 LAB — TSH: TSH: 0.93 mIU/L (ref 0.40–4.50)

## 2016-08-22 NOTE — Progress Notes (Signed)
Subjective:   HPI  Evan Frey is a 41 y.o. male who presents for a complete physical.  Gets neck pain occasionally.  When he sleeps poorly due to the neck pain from time to time, uses flexeril few times per month.   Sometimes has neck stiffness, headache in back of head.  Aggressive mountain biking.  No swimming.  No other aggravating or relieving factors.  Has hx/o GERD, gets a lot of belching, belching sometimes 30 times in the mornings, takes Prilosec daily which really helps the GERD.  He saw GI Dr. Leone PayorGessner over a year ago for same, was advised that he didn't need EGD that time.  He notes his GERD is controlled on Prilosec.  He notes hx/o numbness in both hands during sleep but not during the day.  This has been going on a while.   Denies weakness.  He mountain bikes most days per week, plays drums, on his phone a lot, uses hands often.   Reviewed their medical, surgical, family, social, medication, and allergy history and updated chart as appropriate.  Past Medical History:  Diagnosis Date  . GERD (gastroesophageal reflux disease)   . Low back pain    lumbar, intermittent pain, sees chiropractor from time to time    Past Surgical History:  Procedure Laterality Date  . WISDOM TOOTH EXTRACTION      Social History   Social History  . Marital status: Married    Spouse name: N/A  . Number of children: 0  . Years of education: N/A   Occupational History  . IT   .  [employer]   Social History Main Topics  . Smoking status: Former Smoker    Types: Cigarettes    Quit date: 03/21/2006  . Smokeless tobacco: Never Used  . Alcohol use 8.4 oz/week    14 Cans of beer per week     Comment: 1-2 per night  . Drug use: No  . Sexual activity: Not on file   Other Topics Concern  . 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      Family History   Problem Relation Age of Onset  . Depression Mother   . Arrhythmia Mother   . Other Father        healthy, lives in [place]  . Breast cancer Maternal Aunt   . Cancer Paternal Grandmother        type unknown  . Heart disease Neg Hx   . Diabetes Neg Hx   . Stroke Neg Hx   . Hypertension Neg Hx   . Hyperlipidemia Neg Hx      Current Outpatient Prescriptions:  .  cyclobenzaprine (FLEXERIL) 10 MG tablet, Take 1 tablet (10 mg total) by mouth at bedtime., Disp: 12 tablet, Rfl: 0 .  ibuprofen (ADVIL,MOTRIN) 200 MG tablet, Take 600 mg by mouth every 6 (six) hours as needed., Disp: , Rfl:  .  omeprazole (PRILOSEC) 20 MG capsule, Take 1 capsule (20 mg total) by mouth daily., Disp: 90 capsule, Rfl: 1  No Known Allergies   Review of Systems Constitutional: -fever, -chills, -sweats, -unexpected weight change, -decreased appetite, -fatigue Allergy: -sneezing, -itching, -congestion Dermatology: -changing moles, --rash, -lumps ENT: -runny nose, -ear pain, -sore throat, -hoarseness, -sinus pain, -teeth pain, - ringing in ears, -hearing loss, -nosebleeds Cardiology: -chest pain, -palpitations, -swelling, -difficulty breathing when lying flat, -  waking up short of breath Respiratory: -cough, -shortness of breath, -difficulty breathing with exercise or exertion, -wheezing, -coughing up blood Gastroenterology: -abdominal pain, -nausea, -vomiting, -diarrhea, -constipation, -blood in stool, -changes in bowel movement, -difficulty swallowing or eating Hematology: -bleeding, -bruising   Musculoskeletal: -joint aches, -muscle aches, -joint swelling, -back pain, +neck pain, -cramping, -changes in gait Ophthalmology: denies vision changes, eye redness, itching, discharge Urology: -burning with urination, -difficulty urinating, -blood in urine, -urinary frequency, -urgency, -incontinence Neurology: -headache, -weakness, -tingling, +numbness, -memory loss, -falls, -dizziness Psychology: -depressed mood,  -agitation, -sleep problems      Objective:   Physical Exam  BP 110/82   Pulse 60   Ht 5\' 10"  (1.778 m)   Wt 176 lb 12.8 oz (80.2 kg)   SpO2 98%   BMI 25.37 kg/m   General appearance: alert, no distress, WD/WN, lean white male Skin: few scattered benign appearing macules, no worrisome lesions HEENT: normocephalic, conjunctiva/corneas normal, sclerae anicteric, PERRLA, EOMi, nares patent, no discharge or erythema, pharynx normal Oral cavity: MMM, tongue normal, teeth normal Neck: supple, no lymphadenopathy, no thyromegaly, no masses, normal ROM,non tender, no bruits Chest: non tender, normal shape and expansion Heart: RRR, normal S1, S2, no murmurs Lungs: CTA bilaterally, no wheezes, rhonchi, or rales Abdomen: +bs, soft, non tender, non distended, no masses, no hepatomegaly, no splenomegaly, no bruits Back: non tender, normal ROM, no scoliosis Musculoskeletal: upper extremities non tender, no obvious deformity, normal ROM throughout, lower extremities non tender, no obvious deformity, normal ROM throughout Extremities: no edema, no cyanosis, no clubbing Pulses: 2+ symmetric, upper and lower extremities, normal cap refill Neurological: alert, oriented x 3, CN2-12 intact, strength normal upper extremities and lower extremities, sensation normal throughout, DTRs 2+ throughout, no cerebellar signs, gait normal, -phalens and tinel's Psychiatric: normal affect, behavior normal, pleasant  GU: normal male external genitalia, circumcised, non tender, no masses, no hernia, no lymphadenopathy Rectal: deferred/declined   Assessment and Plan :    Encounter Diagnoses  Name Primary?  . Routine general medical examination at a health care facility Yes  . Belching   . Bloating   . Gastroesophageal reflux disease, esophagitis presence not specified   . Abdominal pain, unspecified abdominal location   . Loose stools   . Neck pain   . Numbness of hand   . Vasovagal attack    Physical exam -  discussed healthy lifestyle, diet, exercise, preventative care, vaccinations, and addressed their concerns.    Discussed monthly self testicular exam.  advised yearly flu shot.  Discussed his hx/o GERD, belching, bloating, loose stools -  Additional labs to screen for food allergy and celiac. Heavy metal screen at his request.  abdominal pain - right sided, resolved, but likely recently was due to abdominal muscle strain.  If this recurs or continues he will let us know  Neck pain - intermittent.  discussed stretching, ROM exercises, can c/t flexeril periodically prn.  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  advised night time reinforced wrist splints and discussed arm and pillow positioning in sleep to avoid direct pressure on arms and elbows.   If this doesn't improve in the coming weeks, then recheck  Vasovagal attack - he had a brief vasovagal attack at the end of phlebotomy today.   We rushed in comforted him, and he recovered with rest, supine positioning, cool fluids and some snacks.   After about 20 minutes he was feeling fine, vitals fine.  We discharged him when he was  clinically much improved.   See your dentist yearly for routine dental care including hygiene visits twice yearly.  See your eye doctor yearly for routine vision care.  Follow-up pending labs.     Tage was seen today for annual exam.  Diagnoses and all orders for this visit:  Routine general medical examination at a health care facility -     Urinalysis Dipstick -     Comprehensive metabolic panel -     CBC -     Lipid panel -     TSH  Belching -     Food Allergy Profile -     Celiac panel -     Heavy Metals Panel, Blood  Bloating -     Food Allergy Profile -     Celiac panel -     Heavy Metals Panel, Blood  Gastroesophageal reflux disease, esophagitis presence not specified -     Food Allergy Profile -     Celiac panel -     Heavy Metals Panel, Blood  Abdominal  pain, unspecified abdominal location -     Food Allergy Profile -     Celiac panel -     Heavy Metals Panel, Blood  Loose stools -     Food Allergy Profile -     Celiac panel -     Heavy Metals Panel, Blood  Neck pain  Numbness of hand  Vasovagal attack

## 2016-08-23 LAB — COMPREHENSIVE METABOLIC PANEL
ALBUMIN: 4.8 g/dL (ref 3.6–5.1)
ALK PHOS: 62 U/L (ref 40–115)
ALT: 26 U/L (ref 9–46)
AST: 28 U/L (ref 10–40)
BILIRUBIN TOTAL: 2.7 mg/dL — AB (ref 0.2–1.2)
BUN: 16 mg/dL (ref 7–25)
CO2: 22 mmol/L (ref 20–31)
Calcium: 9.6 mg/dL (ref 8.6–10.3)
Chloride: 103 mmol/L (ref 98–110)
Creat: 1.08 mg/dL (ref 0.60–1.35)
Glucose, Bld: 83 mg/dL (ref 65–99)
POTASSIUM: 4.2 mmol/L (ref 3.5–5.3)
SODIUM: 138 mmol/L (ref 135–146)
TOTAL PROTEIN: 7.7 g/dL (ref 6.1–8.1)

## 2016-08-23 LAB — FOOD ALLERGY PROFILE
Egg White IgE: <0.1 kU/L
MILK IGE: 0.4 kU/L — AB
Peanut IgE: <0.1 kU/L
Sesame Seed f10: <0.1 kU/L
Shrimp IgE: <0.1 kU/L
Soybean IgE: <0.1 kU/L
Tuna IgE: <0.1 kU/L
Walnut: <0.1 kU/L
Wheat IgE: <0.1 kU/L

## 2016-08-23 LAB — LIPID PANEL
CHOL/HDL RATIO: 3.7 ratio (ref <5.0)
CHOLESTEROL: 187 mg/dL (ref <200)
HDL: 50 mg/dL (ref >40)
LDL Cholesterol: 98 mg/dL (ref <100)
TRIGLYCERIDES: 196 mg/dL — AB (ref <150)
VLDL: 39 mg/dL — AB (ref <30)

## 2016-08-26 LAB — HEAVY METALS PANEL, BLOOD
Arsenic: <3 mcg/L (ref <23)
Mercury, B: <4 mcg/L (ref <=10)

## 2016-09-01 LAB — CELIAC DISEASE COMPREHENSIVE PANEL WITH REFLEXES
IgA: 281 mg/dL (ref 81–463)
Tissue Transglutaminase Ab, IgA: 1 U/mL

## 2016-11-06 DIAGNOSIS — B9689 Other specified bacterial agents as the cause of diseases classified elsewhere: Secondary | ICD-10-CM | POA: Diagnosis not present

## 2016-11-06 DIAGNOSIS — J019 Acute sinusitis, unspecified: Secondary | ICD-10-CM | POA: Diagnosis not present

## 2016-11-08 ENCOUNTER — Ambulatory Visit (INDEPENDENT_AMBULATORY_CARE_PROVIDER_SITE_OTHER): Payer: BLUE CROSS/BLUE SHIELD | Admitting: Family Medicine

## 2016-11-08 VITALS — BP 116/70 | HR 76 | Temp 99.3°F | Wt 180.8 lb

## 2016-11-08 DIAGNOSIS — J029 Acute pharyngitis, unspecified: Secondary | ICD-10-CM | POA: Diagnosis not present

## 2016-11-08 DIAGNOSIS — J40 Bronchitis, not specified as acute or chronic: Secondary | ICD-10-CM

## 2016-11-08 LAB — POCT RAPID STREP A (OFFICE): RAPID STREP A SCREEN: NEGATIVE

## 2016-11-08 NOTE — Patient Instructions (Signed)
Use NyQuil at night. You can take 4 ibuprofen 3 times per day for,

## 2016-11-08 NOTE — Progress Notes (Signed)
   Subjective:    Patient ID: Evan Frey, male    DOB: [birth year], 41 y.o.   MRN: [id number]  HPI He complains of a one-week history of started with sore throat followed by hoarse voice, fever, cough and fatigue. He was seen Sunday in an urgent care center and given a steroid injection as well as Amoxil. He has had 4 doses of the amoxicillin but still has difficulty with fever, fatigue and coughing. No earache, shortness of breath, chest pain   Review of Systems     Objective:   Physical Exam Alert and in no distress. Tympanic membranes and canals are normal. Pharyngeal area is normal. Neck is supple without adenopathy or thyromegaly. Cardiac exam shows a regular sinus rhythm without murmurs or gallops. Lungs are clear to auscultation. Strep screen is negative       Assessment & Plan:  Sore throat - Plan: Rapid Strep A  Bronchitis  I explained it is difficult to know whether he had a viral or bacterial infection but since he is on the antibiotic, continue until he completes the course. Recommend 800 mg 3 times a day of ibuprofen to help with fever, aches and pains. Also use NyQuil at night. If his symptoms worsen he will call.

## 2016-12-06 ENCOUNTER — Other Ambulatory Visit: Payer: Self-pay | Admitting: Internal Medicine

## 2016-12-07 ENCOUNTER — Other Ambulatory Visit: Payer: Self-pay | Admitting: Medical

## 2016-12-07 ENCOUNTER — Telehealth: Payer: Self-pay | Admitting: Medical

## 2016-12-07 MED ORDER — OMEPRAZOLE 40 MG PO CPDR
40.0000 mg | DELAYED_RELEASE_CAPSULE | Freq: Every day | ORAL | 0 refills | Status: DC
Start: 2016-12-07 — End: 2017-02-15

## 2016-12-07 NOTE — Telephone Encounter (Signed)
rx sent

## 2016-12-07 NOTE — Telephone Encounter (Signed)
Pt called for refills of omeprazole. He states that last time it was filled was by Dr. Leone Payor. He would like that filled by Vincenza Hews now. He states that he has been taking his wife's for a while but not needs his own rx. Pt uses [facility] [address] and can be reached at [phone].

## 2017-02-15 ENCOUNTER — Other Ambulatory Visit: Payer: Self-pay | Admitting: Medical

## 2017-03-23 ENCOUNTER — Encounter: Payer: Self-pay | Admitting: Medical

## 2017-03-23 ENCOUNTER — Ambulatory Visit: Payer: BLUE CROSS/BLUE SHIELD | Admitting: Medical

## 2017-03-23 ENCOUNTER — Ambulatory Visit
Admission: RE | Admit: 2017-03-23 | Discharge: 2017-03-23 | Disposition: A | Discharge status: Home / Self Care | Payer: BLUE CROSS/BLUE SHIELD | Source: Ambulatory Visit | Attending: Medical | Admitting: Medical

## 2017-03-23 VITALS — BP 124/78 | HR 81 | Wt 188.2 lb

## 2017-03-23 DIAGNOSIS — R0789 Other chest pain: Secondary | ICD-10-CM | POA: Diagnosis not present

## 2017-03-23 DIAGNOSIS — R140 Abdominal distension (gaseous): Secondary | ICD-10-CM | POA: Diagnosis not present

## 2017-03-23 DIAGNOSIS — R079 Chest pain, unspecified: Secondary | ICD-10-CM

## 2017-03-23 DIAGNOSIS — K219 Gastro-esophageal reflux disease without esophagitis: Secondary | ICD-10-CM

## 2017-03-23 MED ORDER — DEXLANSOPRAZOLE 60 MG PO CPDR: 60.0000 mg | DELAYED_RELEASE_CAPSULE | Freq: Every day | ORAL | 0 refills | Status: DC

## 2017-03-23 NOTE — Progress Notes (Signed)
Subjective: Chief Complaint  Patient presents with  . Chest Pain    chest pain  off and on since thanksgiving,    Here for complaint of chest pain.  He has been seen here for this prior.  He is a non-smoker, very active guy, mountain bikes regularly and says he gives 110% of the mountain bikes.  He also plays drums regularly.  He notes that he gets intermittent chest pain for the last couple weeks and has had this in the past.  Sometimes he can reproduce the pain moving his chest muscles around.  Sometimes feels like a bubble in his chest wall between the sternum and ribs.  For example he had some pain after doing bench presses the other day.  The pain typically is in the chest wall, but he also gets acid reflux symptoms as well.  He was climbing stairs yesterday and did not have any problems no pain or shortness of breath.  However yesterday he had quite a bit of chest pain.  He attributes some of the pain yesterday to acid reflux with some to physical activity.   His wife made him come in today.  He drinks alcohol regularly but not heavily.  No other aggravating or relieving factors. No other complaint.   Past Medical History:  Diagnosis Date  . GERD (gastroesophageal reflux disease)   . Low back pain    lumbar, intermittent pain, sees chiropractor from time to time   Current Outpatient Medications on File Prior to Visit  Medication Sig Dispense Refill  . cyclobenzaprine (FLEXERIL) 10 MG tablet Take 1 tablet (10 mg total) by mouth at bedtime. 12 tablet 0  . ibuprofen (ADVIL,MOTRIN) 200 MG tablet Take 600 mg by mouth every 6 (six) hours as needed.    Marland Kitchen omeprazole (PRILOSEC) 40 MG capsule TAKE 1 CAPSULE BY MOUTH EVERY DAY 90 capsule 0   No current facility-administered medications on file prior to visit.    Family History  Problem Relation Age of Onset  . Depression Mother   . Arrhythmia Mother   . Other Father        healthy, lives in [place]  . Breast cancer Maternal Aunt   .  Cancer Paternal Grandmother        type unknown  . Heart disease Neg Hx   . Diabetes Neg Hx   . Stroke Neg Hx   . Hypertension Neg Hx   . Hyperlipidemia Neg Hx    ROS as in subjective    Objective BP 124/78   Pulse 81   Wt 188 lb 3.2 oz (85.4 kg)   SpO2 97%   BMI 27.00 kg/m   General appearance: alert, no distress, WD/WN,  Neck: supple, no lymphadenopathy, no thyromegaly, no masses Heart: RRR, normal S1, S2, no murmurs Lungs: CTA bilaterally, no wheezes, rhonchi, or rales Chest wall without deformity, normal inspiration and expiration, nontender, no swelling, no bruising Abdomen: +bs, soft, non tender, non distended, no masses, no hepatomegaly, no splenomegaly Pulses: 2+ symmetric, upper and lower extremities, normal cap refill Extremities no edema No musculoskeletal abnormalities today, nontender upper and lower extremities   Adult ECG Report  Indication: chest pain  Rate: 66 bpm  Rhythm: normal sinus rhythm  QRS Axis: 47 degrees  PR Interval:  QRS Duration: 88ms  QTc:  Conduction Disturbances: none  Other Abnormalities: Isolated T wave inversion in 3.    Patient's cardiac risk factors are: male gender.  EKG comparison: 2014  Narrative Interpretation:  NonspecificT wave inversions in 3.  No acute changes.    Assessment: Encounter Diagnoses  Name Primary?  . Chest pain, unspecified type Yes  . Gastroesophageal reflux disease, esophagitis presence not specified   . Bloating      Plan: We discussed his symptoms, possible causes.  He has known GERD issues but has had improvement on omeprazole 40 mg.  He has seen GI in the past but no scope was performed.  He tries to limit some acid causing medicines but does not want his live his life without enjoying certain foods and things.  I gave him samples of Dexilant to try for the next 20 days instead of omeprazole   EKG reviewed, will send for chest x-ray.  I doubt any other significant factor other  than acid reflux and costochondritis.  At his request I will contact Dr. Brennan BaileyGessner/GI to see if he needs an endoscopy  Evan Frey was seen today for chest pain.  Diagnoses and all orders for this visit:  Chest pain, unspecified type -     EKG 12-Lead -     DG Chest 2 View; Future  Gastroesophageal reflux disease, esophagitis presence not specified  Bloating  Other orders -     dexlansoprazole (DEXILANT) 60 MG capsule; Take 1 capsule (60 mg total) by mouth daily.  Follow-up pending chest x-ray

## 2017-07-27 DIAGNOSIS — Z0279 Encounter for issue of other medical certificate: Secondary | ICD-10-CM

## 2018-03-11 IMAGING — CR DG CHEST 2V
2 series · 2 of 2 positions shown · non-contrast
Comparison: None

CLINICAL DATA: Persistent chest discomfort for 8 months, no trauma

EXAM:
CHEST  2 VIEW

[w chest pa]
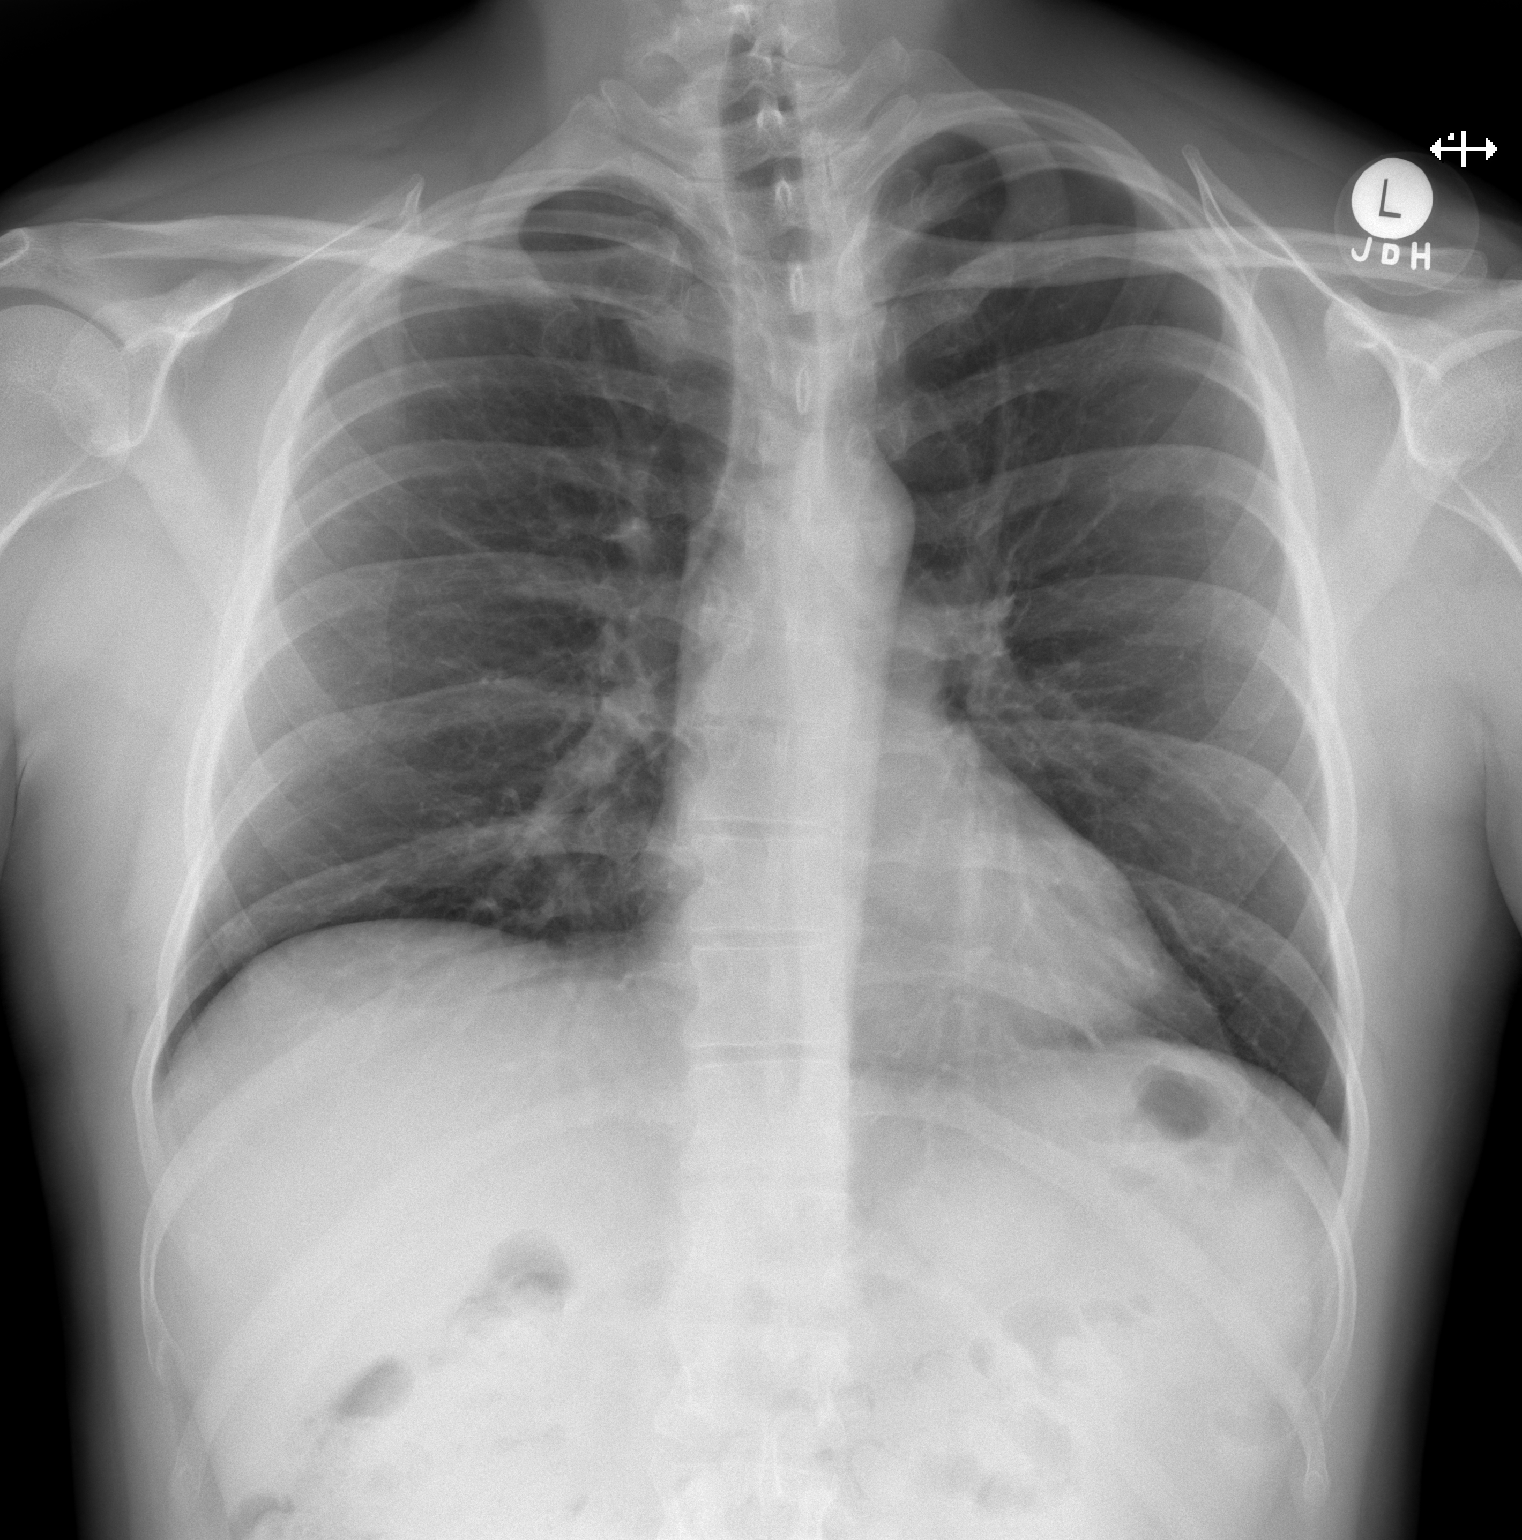

[w chest lat]
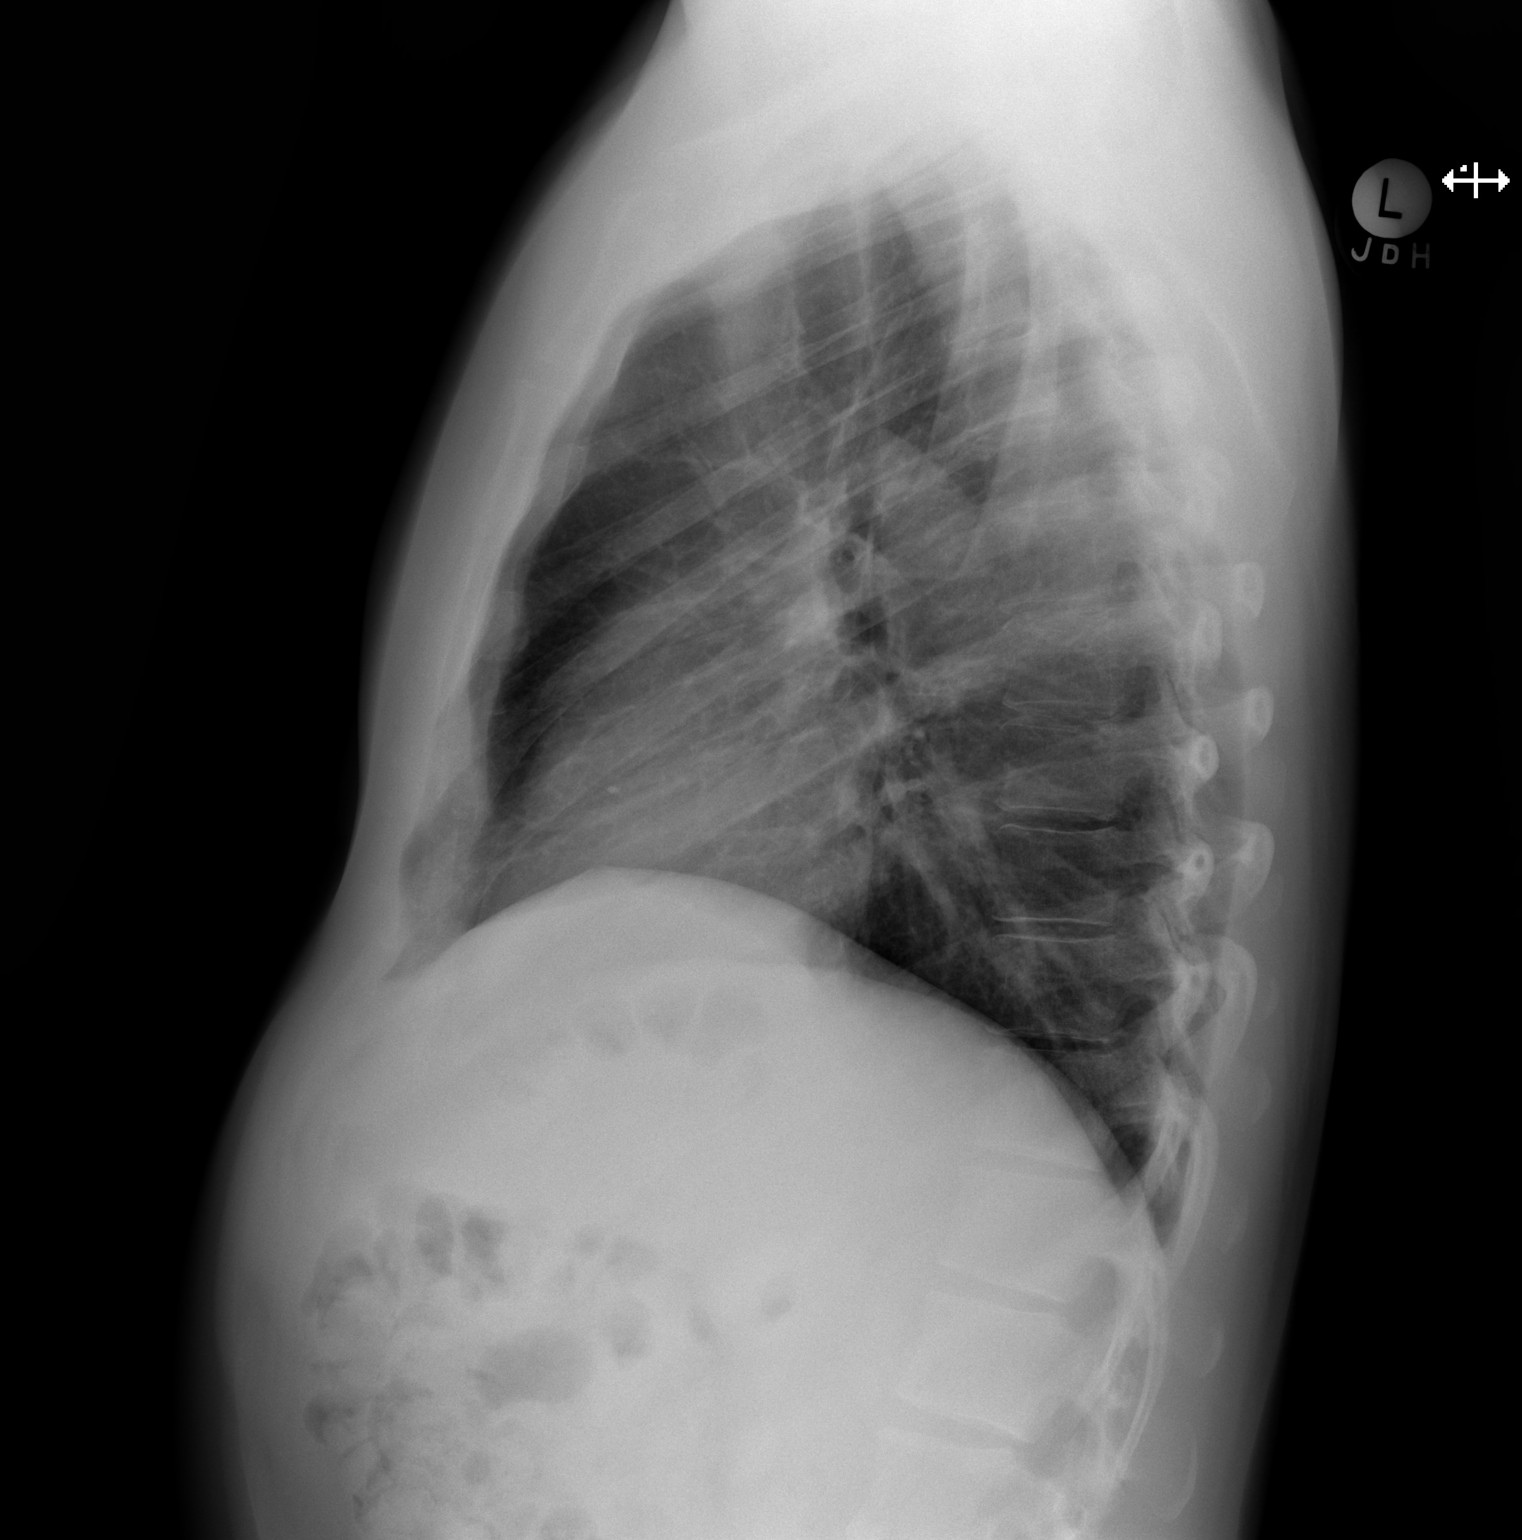

[2 of 2 positions shown; findings below may reference images not displayed]

FINDINGS: Normal heart size, mediastinal contours, and pulmonary vascularity.

Lungs clear.

No pleural effusion or pneumothorax.

Minimal levoconvex scoliosis of the upper thoracic spine.
IMPRESSION: No acute abnormalities.

## 2018-04-11 DIAGNOSIS — M542 Cervicalgia: Secondary | ICD-10-CM | POA: Diagnosis not present

## 2018-04-11 DIAGNOSIS — M546 Pain in thoracic spine: Secondary | ICD-10-CM | POA: Diagnosis not present

## 2018-04-11 DIAGNOSIS — M9902 Segmental and somatic dysfunction of thoracic region: Secondary | ICD-10-CM | POA: Diagnosis not present

## 2018-04-11 DIAGNOSIS — M9901 Segmental and somatic dysfunction of cervical region: Secondary | ICD-10-CM | POA: Diagnosis not present

## 2018-05-01 ENCOUNTER — Other Ambulatory Visit: Payer: Self-pay | Admitting: Medical

## 2018-05-01 MED ORDER — OSELTAMIVIR PHOSPHATE 75 MG PO CAPS: 75.0000 mg | ORAL_CAPSULE | Freq: Every day | ORAL | 0 refills | Status: DC

## 2018-08-10 ENCOUNTER — Ambulatory Visit (INDEPENDENT_AMBULATORY_CARE_PROVIDER_SITE_OTHER): Payer: BLUE CROSS/BLUE SHIELD | Admitting: Medical

## 2018-08-10 ENCOUNTER — Other Ambulatory Visit: Payer: Self-pay

## 2018-08-10 ENCOUNTER — Encounter: Payer: Self-pay | Admitting: Medical

## 2018-08-10 VITALS — BP 120/74 | HR 53 | Temp 97.6°F | Resp 16 | Ht 70.0 in | Wt 184.4 lb

## 2018-08-10 DIAGNOSIS — R109 Unspecified abdominal pain: Secondary | ICD-10-CM

## 2018-08-10 DIAGNOSIS — H9193 Unspecified hearing loss, bilateral: Secondary | ICD-10-CM | POA: Insufficient documentation

## 2018-08-10 DIAGNOSIS — R6882 Decreased libido: Secondary | ICD-10-CM | POA: Insufficient documentation

## 2018-08-10 DIAGNOSIS — Z0000 Encounter for general adult medical examination without abnormal findings: Secondary | ICD-10-CM

## 2018-08-10 DIAGNOSIS — Z79899 Other long term (current) drug therapy: Secondary | ICD-10-CM | POA: Insufficient documentation

## 2018-08-10 DIAGNOSIS — K219 Gastro-esophageal reflux disease without esophagitis: Secondary | ICD-10-CM

## 2018-08-10 DIAGNOSIS — H9313 Tinnitus, bilateral: Secondary | ICD-10-CM | POA: Insufficient documentation

## 2018-08-10 LAB — POCT URINALYSIS DIP (PROADVANTAGE DEVICE)
Bilirubin, UA: NEGATIVE
Blood, UA: NEGATIVE
Glucose, UA: NEGATIVE mg/dL
Ketones, POC UA: NEGATIVE mg/dL
Leukocytes, UA: NEGATIVE
Nitrite, UA: NEGATIVE
Protein Ur, POC: NEGATIVE mg/dL
Specific Gravity, Urine: 1.015
Urobilinogen, Ur: NEGATIVE
pH, UA: 6 (ref 5.0–8.0)

## 2018-08-10 NOTE — Progress Notes (Signed)
Subjective:   HPI  Evan Frey is a 43 y.o. male who presents for Chief Complaint  Patient presents with  . CPE    fasting CPE    Patient Care Team: [name], Kermit Balo, PA-C as PCP - General (Family Medicine) Sees dentist Sees eye doctor  Concerns: Long hx/o GERD, and hates having to use medication.  He wants EGD to rule out h pylori or other.  Would like to come off PPI.    Occasionally gets costochondritis, using foam roller on back BID for a day to 2 helps resolve this.   Mother passed away earlier this morning of breast cancer unexpectedly.    Reviewed their medical, surgical, family, social, medication, and allergy history and updated chart as appropriate.  Past Medical History:  Diagnosis Date  . GERD (gastroesophageal reflux disease)   . Low back pain    lumbar, intermittent pain, sees chiropractor from time to time    Past Surgical History:  Procedure Laterality Date  . WISDOM TOOTH EXTRACTION      Social History   Socioeconomic History  . Marital status: Married    Spouse name: Not on file  . Number of children: 0  . Years of education: Not on file  . Highest education level: Not on file  Occupational History  . Occupation: IT    Employer: [employer]  Social Needs  . Financial resource strain: Not on file  . Food insecurity:    Worry: Not on file    Inability: Not on file  . Transportation needs:    Medical: Not on file    Non-medical: Not on file  Tobacco Use  . Smoking status: Former Smoker    Types: Cigarettes    Last attempt to quit: 03/21/2006    Years since quitting: 12.3  . Smokeless tobacco: Never Used  Substance and Sexual Activity  . Alcohol use: Yes    Alcohol/week: 14.0 standard drinks    Types: 14 Cans of beer per week    Comment: 1-2 per night  . Drug use: No  . Sexual activity: Not on file  Lifestyle  . Physical activity:    Days per week: Not on file    Minutes per session: Not on file  . Stress: Not on file   Relationships  . Social connections:    Talks on phone: Not on file    Gets together: Not on file    Attends religious service: Not on file    Active member of club or organization: Not on file    Attends meetings of clubs or organizations: Not on file    Relationship status: Not on file  . Intimate partner violence:    Fear of current or ex partner: Not on file    Emotionally abused: Not on file    Physically abused: Not on file    Forced sexual activity: Not on file  Other Topics Concern  . 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      Family History  Problem Relation Age of Onset  . Depression Mother   . Arrhythmia Mother   . Cancer Mother 15       breast, died of mets  . Bipolar disorder Mother   . Other Father        healthy, lives in [place]  . Arrhythmia Brother   .  Breast cancer Maternal Aunt   . Cancer Paternal Grandmother        type unknown  . Heart disease Neg Hx   . Diabetes Neg Hx   . Stroke Neg Hx   . Hypertension Neg Hx   . Hyperlipidemia Neg Hx      Current Outpatient Medications:  .  cyclobenzaprine (FLEXERIL) 10 MG tablet, Take 1 tablet (10 mg total) by mouth at bedtime., Disp: 12 tablet, Rfl: 0 .  omeprazole (PRILOSEC) 40 MG capsule, TAKE 1 CAPSULE BY MOUTH EVERY DAY, Disp: 90 capsule, Rfl: 0 .  dexlansoprazole (DEXILANT) 60 MG capsule, Take 1 capsule (60 mg total) by mouth daily. (Patient not taking: Reported on 08/10/2018), Disp: 20 capsule, Rfl: 0 .  ibuprofen (ADVIL,MOTRIN) 200 MG tablet, Take 600 mg by mouth every 6 (six) hours as needed., Disp: , Rfl:   No Known Allergies     Review of Systems Constitutional: -fever, -chills, -sweats, -unexpected weight change, -decreased appetite, -fatigue Allergy: -sneezing, -itching, -congestion Dermatology: -changing moles, --rash, -lumps ENT: -runny nose, -ear pain,  -sore throat, -hoarseness, -sinus pain, -teeth pain, - ringing in ears, -hearing loss, -nosebleeds Cardiology: -chest pain, -palpitations, -swelling, -difficulty breathing when lying flat, -waking up short of breath Respiratory: -cough, -shortness of breath, -difficulty breathing with exercise or exertion, -wheezing, -coughing up blood Gastroenterology: -abdominal pain, -nausea, -vomiting, -diarrhea, -constipation, -blood in stool, -changes in bowel movement, -difficulty swallowing or eating Hematology: -bleeding, -bruising  Musculoskeletal: -joint aches, -muscle aches, -joint swelling, -back pain, -neck pain, -cramping, -changes in gait Ophthalmology: denies vision changes, eye redness, itching, discharge Urology: -burning with urination, -difficulty urinating, -blood in urine, -urinary frequency, -urgency, -incontinence Neurology: -headache, -weakness, -tingling, -numbness, -memory loss, -falls, -dizziness Psychology: -depressed mood, -agitation, -sleep problems Male GU: no testicular mass, pain, no lymph nodes swollen, no swelling, no rash.     Objective:  BP 120/74   Pulse (!) 53   Temp 97.6 F (36.4 C) (Temporal)   Resp 16   Ht  (1.778 m)   Wt 184 lb 6.4 oz (83.6 kg)   SpO2 98%   BMI 26.46 kg/m   General appearance: alert, no distress, WD/WN, Caucasian male Skin: unremarkable HEENT: normocephalic, conjunctiva/corneas normal, sclerae anicteric, PERRLA, EOMi, nares patent, no discharge or erythema, pharynx normal Oral cavity: MMM, tongue normal, teeth normal Neck: supple, no lymphadenopathy, no thyromegaly, no masses, normal ROM, no bruits Chest: non tender, normal shape and expansion Heart: RRR, normal S1, S2, no murmurs Lungs: CTA bilaterally, no wheezes, rhonchi, or rales Abdomen: +bs, soft, non tender, non distended, no masses, no hepatomegaly, no splenomegaly, no bruits Back: non tender, normal ROM, no scoliosis Musculoskeletal: upper extremities non tender, no  obvious deformity, normal ROM throughout, lower extremities non tender, no obvious deformity, normal ROM throughout Extremities: no edema, no cyanosis, no clubbing Pulses: 2+ symmetric, upper and lower extremities, normal cap refill Neurological: alert, oriented x 3, CN2-12 intact, strength normal upper extremities and lower extremities, sensation normal throughout, DTRs 2+ throughout, no cerebellar signs, gait normal Psychiatric: normal affect, behavior normal, pleasant  GU: normal male external genitalia,circumcised, nontender, no masses, no hernia, no lymphadenopathy Rectal: deferred   Assessment and Plan :   Encounter Diagnoses  Name Primary?  . Routine general medical examination at a health care facility Yes  . Abdominal pain, unspecified abdominal location   . Low libido   . Gastroesophageal reflux disease, esophagitis presence not specified   . Bilateral change in hearing   . High risk  medication use   . Tinnitus of both ears    I expressed sympathy for his loss of mother recently  Physical exam - discussed and counseled on healthy lifestyle, diet, exercise, preventative care, vaccinations, sick and well care, proper use of emergency dept and after hours care, and addressed their concerns.    Health screening: See your eye doctor yearly for routine vision care. See your dentist yearly for routine dental care including hygiene visits twice yearly.  Cancer screening Advised monthly self testicular exam  Colonoscopy:  Advised age 43yo  Vaccinations: Advised yearly influenza vaccine Will be due for Td 2021   Separate significant issues discussed: GERD - referral back for updated consult and EGD  TST lab today  Given family history of arrhythmia, he will watch for symptoms.   We can pursue event monitor if he starts having concerns   Leonette MostCharles was seen today for cpe.  Diagnoses and all orders for this visit:  Routine general medical examination at a health care  facility -     POCT Urinalysis DIP (Proadvantage Device) -     TSH -     Testosterone -     CBC -     Basic metabolic panel  Abdominal pain, unspecified abdominal location  Low libido -     TSH -     Testosterone  Gastroesophageal reflux disease, esophagitis presence not specified  Bilateral change in hearing  High risk medication use -     CBC -     Basic metabolic panel  Tinnitus of both ears    Follow-up pending labs, yearly for physical

## 2018-08-11 LAB — BASIC METABOLIC PANEL
BUN/Creatinine Ratio: 12 (ref 9–20)
BUN: 13 mg/dL (ref 6–24)
CO2: 25 mmol/L (ref 20–29)
Calcium: 9.9 mg/dL (ref 8.7–10.2)
Chloride: 99 mmol/L (ref 96–106)
Creatinine, Ser: 1.1 mg/dL (ref 0.76–1.27)
GFR calc Af Amer: 95 mL/min/{1.73_m2} (ref >59)
GFR calc non Af Amer: 82 mL/min/{1.73_m2} (ref >59)
Glucose: 94 mg/dL (ref 65–99)
Potassium: 4.4 mmol/L (ref 3.5–5.2)
Sodium: 140 mmol/L (ref 134–144)

## 2018-08-11 LAB — CBC
Hematocrit: 45 % (ref 37.5–51.0)
Hemoglobin: 16.1 g/dL (ref 13.0–17.7)
MCH: 32.2 pg (ref 26.6–33.0)
MCHC: 35.8 g/dL — ABNORMAL HIGH (ref 31.5–35.7)
MCV: 90 fL (ref 79–97)
Platelets: 267 10*3/uL (ref 150–450)
RBC: 5 x10E6/uL (ref 4.14–5.80)
RDW: 12.3 % (ref 11.6–15.4)
WBC: 7.5 10*3/uL (ref 3.4–10.8)

## 2018-08-11 LAB — TESTOSTERONE: Testosterone: 510 ng/dL (ref 264–916)

## 2018-08-11 LAB — TSH: TSH: 1.29 u[IU]/mL (ref 0.450–4.500)

## 2018-08-15 ENCOUNTER — Telehealth: Payer: Self-pay | Admitting: Internal Medicine

## 2018-08-15 NOTE — Telephone Encounter (Signed)
Patient scheduled for  EGD and pre-visit

## 2018-08-15 NOTE — Telephone Encounter (Signed)
I spoke to [name], [name]  About Evan Frey.  Evan Frey is interested in trying to come off PPI - I think reasonable to set up for an EGD to evaluate anatomy (hiatal hernia, ? Candidate for TIF, surgery)   Please arrange direct EGD  Dx GERD  If the patient has other ? After explaining this let me know

## 2018-08-15 NOTE — Telephone Encounter (Signed)
Dr. Leone Payor,  Please call Kermit Balo. [place], [place] [phone] regarding mutual patient. Mr. Aleen Campi states that patient continues to have GERD(no longer wanting to take medication for this) and is requesting to have a Direct EGD. Patient does not want to have an OV first unless he knows that he will definitely be getting an EGD done.

## 2018-08-21 ENCOUNTER — Other Ambulatory Visit: Payer: Self-pay

## 2018-08-21 ENCOUNTER — Ambulatory Visit (AMBULATORY_SURGERY_CENTER): Payer: Self-pay | Admitting: *Deleted

## 2018-08-21 VITALS — Ht 70.0 in | Wt 183.0 lb

## 2018-08-21 DIAGNOSIS — K219 Gastro-esophageal reflux disease without esophagitis: Secondary | ICD-10-CM

## 2018-08-21 NOTE — Progress Notes (Signed)

## 2018-08-22 ENCOUNTER — Encounter: Payer: Self-pay | Admitting: Internal Medicine

## 2018-08-29 ENCOUNTER — Encounter: Payer: BLUE CROSS/BLUE SHIELD | Admitting: Internal Medicine

## 2018-10-22 DIAGNOSIS — M9905 Segmental and somatic dysfunction of pelvic region: Secondary | ICD-10-CM | POA: Diagnosis not present

## 2018-10-22 DIAGNOSIS — M9902 Segmental and somatic dysfunction of thoracic region: Secondary | ICD-10-CM | POA: Diagnosis not present

## 2018-10-22 DIAGNOSIS — M9904 Segmental and somatic dysfunction of sacral region: Secondary | ICD-10-CM | POA: Diagnosis not present

## 2018-10-22 DIAGNOSIS — M9903 Segmental and somatic dysfunction of lumbar region: Secondary | ICD-10-CM | POA: Diagnosis not present

## 2019-01-09 DIAGNOSIS — M9902 Segmental and somatic dysfunction of thoracic region: Secondary | ICD-10-CM | POA: Diagnosis not present

## 2019-01-09 DIAGNOSIS — M9903 Segmental and somatic dysfunction of lumbar region: Secondary | ICD-10-CM | POA: Diagnosis not present

## 2019-01-09 DIAGNOSIS — M9904 Segmental and somatic dysfunction of sacral region: Secondary | ICD-10-CM | POA: Diagnosis not present

## 2019-01-09 DIAGNOSIS — M9905 Segmental and somatic dysfunction of pelvic region: Secondary | ICD-10-CM | POA: Diagnosis not present

## 2019-02-06 DIAGNOSIS — M9905 Segmental and somatic dysfunction of pelvic region: Secondary | ICD-10-CM | POA: Diagnosis not present

## 2019-02-06 DIAGNOSIS — M9902 Segmental and somatic dysfunction of thoracic region: Secondary | ICD-10-CM | POA: Diagnosis not present

## 2019-02-06 DIAGNOSIS — M9904 Segmental and somatic dysfunction of sacral region: Secondary | ICD-10-CM | POA: Diagnosis not present

## 2019-02-06 DIAGNOSIS — M9903 Segmental and somatic dysfunction of lumbar region: Secondary | ICD-10-CM | POA: Diagnosis not present

## 2019-03-13 DIAGNOSIS — M9902 Segmental and somatic dysfunction of thoracic region: Secondary | ICD-10-CM | POA: Diagnosis not present

## 2019-03-13 DIAGNOSIS — M9904 Segmental and somatic dysfunction of sacral region: Secondary | ICD-10-CM | POA: Diagnosis not present

## 2019-03-13 DIAGNOSIS — M9905 Segmental and somatic dysfunction of pelvic region: Secondary | ICD-10-CM | POA: Diagnosis not present

## 2019-03-13 DIAGNOSIS — M9903 Segmental and somatic dysfunction of lumbar region: Secondary | ICD-10-CM | POA: Diagnosis not present

## 2019-04-10 DIAGNOSIS — M9903 Segmental and somatic dysfunction of lumbar region: Secondary | ICD-10-CM | POA: Diagnosis not present

## 2019-04-10 DIAGNOSIS — M9904 Segmental and somatic dysfunction of sacral region: Secondary | ICD-10-CM | POA: Diagnosis not present

## 2019-04-10 DIAGNOSIS — M9905 Segmental and somatic dysfunction of pelvic region: Secondary | ICD-10-CM | POA: Diagnosis not present

## 2019-04-10 DIAGNOSIS — M9902 Segmental and somatic dysfunction of thoracic region: Secondary | ICD-10-CM | POA: Diagnosis not present

## 2019-04-17 DIAGNOSIS — F900 Attention-deficit hyperactivity disorder, predominantly inattentive type: Secondary | ICD-10-CM | POA: Diagnosis not present

## 2019-04-17 DIAGNOSIS — R41840 Attention and concentration deficit: Secondary | ICD-10-CM | POA: Diagnosis not present

## 2019-05-08 DIAGNOSIS — M9905 Segmental and somatic dysfunction of pelvic region: Secondary | ICD-10-CM | POA: Diagnosis not present

## 2019-05-08 DIAGNOSIS — M9902 Segmental and somatic dysfunction of thoracic region: Secondary | ICD-10-CM | POA: Diagnosis not present

## 2019-05-08 DIAGNOSIS — M9903 Segmental and somatic dysfunction of lumbar region: Secondary | ICD-10-CM | POA: Diagnosis not present

## 2019-05-08 DIAGNOSIS — M9904 Segmental and somatic dysfunction of sacral region: Secondary | ICD-10-CM | POA: Diagnosis not present

## 2019-09-10 ENCOUNTER — Other Ambulatory Visit: Payer: Self-pay

## 2019-09-10 ENCOUNTER — Ambulatory Visit: Payer: BC Managed Care – PPO | Admitting: Medical

## 2019-09-10 ENCOUNTER — Encounter: Payer: Self-pay | Admitting: Medical

## 2019-09-10 VITALS — BP 120/84 | HR 71 | Ht 70.0 in | Wt 189.0 lb

## 2019-09-10 DIAGNOSIS — R110 Nausea: Secondary | ICD-10-CM | POA: Diagnosis not present

## 2019-09-10 DIAGNOSIS — G4459 Other complicated headache syndrome: Secondary | ICD-10-CM | POA: Diagnosis not present

## 2019-09-10 MED ORDER — ONDANSETRON 4 MG PO TBDP: 4.0000 mg | ORAL_TABLET | Freq: Four times a day (QID) | ORAL | 0 refills | Status: DC | PRN

## 2019-09-10 MED ORDER — SUMATRIPTAN SUCCINATE 50 MG PO TABS: 50.0000 mg | ORAL_TABLET | ORAL | 0 refills | Status: DC | PRN

## 2019-09-10 NOTE — Progress Notes (Signed)
Subjective: Chief Complaint  Patient presents with  . Migraine    couple og years    Here for headaches.  He gets some bad headaches ever now and then.  Used to get bad headaches as an adolescent.  Didn't have a lot of headaches in 20-30s years old.  Lately thought, gets really bad headache 1-2 times per month.   Will awake, and immediately know his day is ruined.    Headache will have photosensitivity, headache around eyes, back of head will hurt.  Generally all over not one sided.  headache will last most of the day, generally not going into the next day.  Gets nauseated and vomits with the headaches.    No numbness or tingling.  No nosebleeds.  No slurred speech, no vision loss, no hearing loss or ringing.    Headaches not worse with physical activity, but he thinks if he was doing something physical it would worsen the headache.   Been seeing a chiropractor for long term back pain and neck and upper back injury 4 years ago mountain biking where he may have had a concussion.  He denies allergy and chronic sinus problems.  He denies snoring, witnessed apnea, fatigue.  He does not feel stressed.  He is not drinking excessive caffeine or alcohol.  He denies disequilibrium, denies headache with physical activity.  He cannot think of any specific trigger.  The headaches do usually occur upon awakening and not at other times of the day  No other aggravating or relieving factors. No other complaint.   Past Medical History:  Diagnosis Date  . Allergy    unsure- mild   . GERD (gastroesophageal reflux disease)   . Low back pain    lumbar, intermittent pain, sees chiropractor from time to time    Current Outpatient Medications on File Prior to Visit  Medication Sig Dispense Refill  . omeprazole (PRILOSEC) 40 MG capsule TAKE 1 CAPSULE BY MOUTH EVERY DAY 90 capsule 0  . cyclobenzaprine (FLEXERIL) 10 MG tablet Take 1 tablet (10 mg total) by mouth at bedtime. (Patient not taking: Reported on  09/10/2019) 12 tablet 0  . dexlansoprazole (DEXILANT) 60 MG capsule Take 1 capsule (60 mg total) by mouth daily. (Patient not taking: Reported on 08/10/2018) 20 capsule 0  . ibuprofen (ADVIL,MOTRIN) 200 MG tablet Take 600 mg by mouth every 6 (six) hours as needed. (Patient not taking: Reported on 09/10/2019)     No current facility-administered medications on file prior to visit.   Family History  Problem Relation Age of Onset  . Depression Mother   . Arrhythmia Mother   . Cancer Mother 53       breast, died of mets  . Bipolar disorder Mother   . Breast cancer Mother   . Other Father        healthy, lives in [place]  . Arrhythmia Brother   . Breast cancer Maternal Aunt   . Cancer Paternal Grandmother        type unknown  . Heart disease Neg Hx   . Diabetes Neg Hx   . Stroke Neg Hx   . Hypertension Neg Hx   . Hyperlipidemia Neg Hx   . Colon cancer Neg Hx   . Colon polyps Neg Hx   . Esophageal cancer Neg Hx   . Rectal cancer Neg Hx   . Stomach cancer Neg Hx      ROS as in subjective   Objective: BP 120/84   Pulse 71  Ht 5\' 10"  (1.778 m)   Wt 189 lb (85.7 kg)   SpO2 98%   BMI 27.12 kg/m   General appearence: alert, no distress, WD/WN, white male HEENT: normocephalic, sclerae anicteric, PERRLA, EOMi, nares patent, no discharge or erythema, pharynx normal Neck: supple, no lymphadenopathy, no thyromegaly, no masses, no bruits Heart: RRR, normal S1, S2, no murmurs Lungs: CTA bilaterally, no wheezes, rhonchi, or rales Back: non tender Musculoskeletal: nontender, no swelling, no obvious deformity Extremities: no edema, no cyanosis, no clubbing Pulses: 2+ symmetric, upper and lower extremities, normal cap refill Neurological: alert, oriented x 3, CN2-12 intact, strength normal upper extremities and lower extremities, sensation normal throughout, DTRs 2+ throughout, no cerebellar signs, gait normal Psychiatric: normal affect, behavior normal, pleasant      Assessment: Encounter Diagnoses  Name Primary?  . Other complicated headache syndrome Yes  . Nausea      Plan: We discussed his relatively new onset headaches that occur upon waking.  There is no specific pattern although they do have some features of migraines.  No specific trigger identified.   He did have success using Imitrex from his sister-in-law.  So I prescribed medication as below for acute headache.   I recommended head scan to rule out anything concerning.  He will consider and let me know in the near future.  We discussed common triggers for headaches and the debilitating primary and secondary headaches.  He will continue to monitor and watch for any particular trigger.  He will limit caffeine and alcohol.  We briefly discussed stress.  Evan Frey was seen today for migraine.  Diagnoses and all orders for this visit:  Other complicated headache syndrome  Nausea  Other orders -     SUMAtriptan (IMITREX) 50 MG tablet; Take 1 tablet (50 mg total) by mouth every 2 (two) hours as needed for migraine. May repeat in 2 hours if headache persists or recurs. -     ondansetron (ZOFRAN ODT) 4 MG disintegrating tablet; Take 1 tablet (4 mg total) by mouth every 6 (six) hours as needed for nausea or vomiting.   F/u pending call back

## 2019-10-03 DIAGNOSIS — H5203 Hypermetropia, bilateral: Secondary | ICD-10-CM | POA: Diagnosis not present

## 2019-12-03 DIAGNOSIS — M9903 Segmental and somatic dysfunction of lumbar region: Secondary | ICD-10-CM | POA: Diagnosis not present

## 2019-12-03 DIAGNOSIS — G44209 Tension-type headache, unspecified, not intractable: Secondary | ICD-10-CM | POA: Diagnosis not present

## 2019-12-03 DIAGNOSIS — M4316 Spondylolisthesis, lumbar region: Secondary | ICD-10-CM | POA: Diagnosis not present

## 2019-12-03 DIAGNOSIS — M9901 Segmental and somatic dysfunction of cervical region: Secondary | ICD-10-CM | POA: Diagnosis not present

## 2020-04-21 ENCOUNTER — Encounter: Payer: Self-pay | Admitting: Medical

## 2020-04-21 ENCOUNTER — Telehealth (INDEPENDENT_AMBULATORY_CARE_PROVIDER_SITE_OTHER): Payer: No Typology Code available for payment source | Admitting: Medical

## 2020-04-21 ENCOUNTER — Other Ambulatory Visit: Payer: Self-pay

## 2020-04-21 VITALS — Ht 70.0 in | Wt 167.0 lb

## 2020-04-21 DIAGNOSIS — R0982 Postnasal drip: Secondary | ICD-10-CM

## 2020-04-21 DIAGNOSIS — J029 Acute pharyngitis, unspecified: Secondary | ICD-10-CM

## 2020-04-21 NOTE — Progress Notes (Signed)
Subjective:  Evan Frey is a 45 y.o. male who presents for Chief Complaint  Patient presents with  . Sore Throat    Pt stated tonsil is swollen and painful. If head turn to the left and he swallow it is very painful.      Here for discomfort on right tonsil/throat area.   Started few days ago. Only hurts if swallowing and turning head to left. otherwise denies constant sore throat. Denies fever, body aches, chills, cough, congestion, sneezing, nausea vomiting or diarrhea. No swollen lymph nodes. Otherwise normal state of health.  He did do a very intense cardio session the other day and felt drained and exhausted afterwards.  No sick contacts.  No other aggravating or relieving factors.    No other c/o.  The following portions of the patient's history were reviewed and updated as appropriate: allergies, current medications, past family history, past medical history, past social history, past surgical history and problem list.  ROS Otherwise as in subjective above  Objective: Ht 5\' 10"  (1.778 m)   Wt 167 lb (75.8 kg)   BMI 23.96 kg/m   General appearance: alert, no distress, well developed, well nourished HEENT: normocephalic, sclerae anicteric, conjunctiva pink and moist, TMs pearly, nares patent, no discharge or erythema, pharynx with mild erythema and some mucus postnasal drainage Oral cavity: MMM, no lesions Neck: supple, mild tenderness on the right side but no obvious lymphadenopathy, no thyromegaly, no masses   Assessment: Encounter Diagnoses  Name Primary?  . Post-nasal drainage Yes  . Sore throat      Plan: We discussed symptoms and exam findings suggesting postnasal drainage. Advised over-the-counter antihistamine for the next week, salt water gargles, warm fluids, good hydration, can use ibuprofen OTC twice daily for inflammation for the next few days. If much worse or not improving in the next 3 to 4 days then call back. I suspect symptoms will resolve over  the next week  Evan Frey was seen today for sore throat.  Diagnoses and all orders for this visit:  Post-nasal drainage  Sore throat    Follow up: As needed

## 2020-06-01 ENCOUNTER — Encounter: Payer: Self-pay | Admitting: Medical

## 2020-06-01 ENCOUNTER — Other Ambulatory Visit: Payer: Self-pay

## 2020-06-01 ENCOUNTER — Ambulatory Visit (INDEPENDENT_AMBULATORY_CARE_PROVIDER_SITE_OTHER): Payer: No Typology Code available for payment source | Admitting: Medical

## 2020-06-01 VITALS — BP 132/86 | HR 72 | Ht 70.0 in | Wt 169.6 lb

## 2020-06-01 DIAGNOSIS — Z13220 Encounter for screening for lipoid disorders: Secondary | ICD-10-CM

## 2020-06-01 DIAGNOSIS — B079 Viral wart, unspecified: Secondary | ICD-10-CM

## 2020-06-01 DIAGNOSIS — Z0000 Encounter for general adult medical examination without abnormal findings: Secondary | ICD-10-CM

## 2020-06-01 DIAGNOSIS — R42 Dizziness and giddiness: Secondary | ICD-10-CM | POA: Diagnosis not present

## 2020-06-01 DIAGNOSIS — M25531 Pain in right wrist: Secondary | ICD-10-CM

## 2020-06-01 DIAGNOSIS — Z23 Encounter for immunization: Secondary | ICD-10-CM | POA: Diagnosis not present

## 2020-06-01 DIAGNOSIS — Z8249 Family history of ischemic heart disease and other diseases of the circulatory system: Secondary | ICD-10-CM | POA: Diagnosis not present

## 2020-06-01 LAB — CBC WITH DIFFERENTIAL/PLATELET
Basos: 0 %
Hemoglobin: 15.4 g/dL (ref 13.0–17.7)
Immature Grans (Abs): 0 10*3/uL (ref 0.0–0.1)
Lymphocytes Absolute: 2.6 10*3/uL (ref 0.7–3.1)
Neutrophils Absolute: 3.8 10*3/uL (ref 1.4–7.0)
Neutrophils: 53 %

## 2020-06-01 LAB — COMPREHENSIVE METABOLIC PANEL: Chloride: 100 mmol/L (ref 96–106)

## 2020-06-01 LAB — LIPID PANEL

## 2020-06-01 NOTE — Progress Notes (Signed)
Subjective:   HPI  Evan Frey is a 45 y.o. male who presents for Chief Complaint  Patient presents with  . Annual Exam    Pt present for yearly physical with fasting labs     Patient Care Team: Jasmeen Fritsch, Kermit Balo, PA-C as PCP - General (Family Medicine) Sees dentist Sees eye doctor  Concerns: Has some popping in right wrist off and on.  Is weight lifting regularly, gets some popping in wrist.  Gives some discomfort.    He notes from time to time gets lightheaded if standing up fast from squatting position.  Reviewed their medical, surgical, family, social, medication, and allergy history and updated chart as appropriate.  Past Medical History:  Diagnosis Date  . Allergy    unsure- mild   . GERD (gastroesophageal reflux disease)   . Low back pain    lumbar, intermittent pain, sees chiropractor from time to time    Past Surgical History:  Procedure Laterality Date  . WISDOM TOOTH EXTRACTION      Family History  Problem Relation Age of Onset  . Depression Mother   . Arrhythmia Mother   . Cancer Mother 37       breast, died of mets  . Bipolar disorder Mother   . Breast cancer Mother   . Other Father        healthy, lives in [place]  . Arrhythmia Brother   . Breast cancer Maternal Aunt   . Cancer Paternal Grandmother        type unknown  . Heart disease Neg Hx   . Diabetes Neg Hx   . Stroke Neg Hx   . Hypertension Neg Hx   . Hyperlipidemia Neg Hx   . Colon cancer Neg Hx   . Colon polyps Neg Hx   . Esophageal cancer Neg Hx   . Rectal cancer Neg Hx   . Stomach cancer Neg Hx      Current Outpatient Medications:  .  cyclobenzaprine (FLEXERIL) 10 MG tablet, Take 1 tablet (10 mg total) by mouth at bedtime. (Patient not taking: No sig reported), Disp: 12 tablet, Rfl: 0 .  dexlansoprazole (DEXILANT) 60 MG capsule, Take 1 capsule (60 mg total) by mouth daily. (Patient not taking: No sig reported), Disp: 20 capsule, Rfl: 0 .  ibuprofen (ADVIL,MOTRIN) 200 MG  tablet, Take 600 mg by mouth every 6 (six) hours as needed. (Patient not taking: No sig reported), Disp: , Rfl:  .  omeprazole (PRILOSEC) 40 MG capsule, TAKE 1 CAPSULE BY MOUTH EVERY DAY (Patient not taking: No sig reported), Disp: 90 capsule, Rfl: 0 .  ondansetron (ZOFRAN ODT) 4 MG disintegrating tablet, Take 1 tablet (4 mg total) by mouth every 6 (six) hours as needed for nausea or vomiting. (Patient not taking: No sig reported), Disp: 20 tablet, Rfl: 0 .  SUMAtriptan (IMITREX) 50 MG tablet, Take 1 tablet (50 mg total) by mouth every 2 (two) hours as needed for migraine. May repeat in 2 hours if headache persists or recurs. (Patient not taking: No sig reported), Disp: 10 tablet, Rfl: 0  No Known Allergies     Review of Systems Constitutional: -fever, -chills, -sweats, -unexpected weight change, -decreased appetite, -fatigue Allergy: -sneezing, -itching, -congestion Dermatology: -changing moles, --rash, -lumps ENT: -runny nose, -ear pain, -sore throat, -hoarseness, -sinus pain, -teeth pain, - ringing in ears, -hearing loss, -nosebleeds Cardiology: -chest pain, -palpitations, -swelling, -difficulty breathing when lying flat, -waking up short of breath Respiratory: -cough, -shortness of breath, -difficulty breathing  with exercise or exertion, -wheezing, -coughing up blood Gastroenterology: -abdominal pain, -nausea, -vomiting, -diarrhea, -constipation, -blood in stool, -changes in bowel movement, -difficulty swallowing or eating Hematology: -bleeding, -bruising  Musculoskeletal: -joint aches, -muscle aches, -joint swelling, -back pain, -neck pain, -cramping, -changes in gait Ophthalmology: denies vision changes, eye redness, itching, discharge Urology: -burning with urination, -difficulty urinating, -blood in urine, -urinary frequency, -urgency, -incontinence Neurology: -headache, -weakness, -tingling, -numbness, -memory loss, -falls, +dizziness Psychology: -depressed mood, -agitation, -sleep  problems Male GU: no testicular mass, pain, no lymph nodes swollen, no swelling, no rash.     Objective:  BP 132/86   Pulse 72   Ht 5\' 10"  (1.778 m)   Wt 169 lb 9.6 oz (76.9 kg)   SpO2 97%   BMI 24.34 kg/m   General appearance: alert, no distress, WD/WN, Caucasian male Skin: right thumb anterior surface proximal phalanx 51mm raised verruca lesion, no other lesions.  HEENT: normocephalic, conjunctiva/corneas normal, sclerae anicteric, PERRLA, EOMi, nares patent, no discharge or erythema, pharynx normal Oral cavity: MMM, tongue normal, teeth normal Neck: supple, no lymphadenopathy, no thyromegaly, no masses, normal ROM, no bruits Chest: non tender, normal shape and expansion Heart: RRR, normal S1, S2, no murmurs Lungs: CTA bilaterally, no wheezes, rhonchi, or rales Abdomen: +bs, soft, non tender, non distended, no masses, no hepatomegaly, no splenomegaly, no bruits Back: non tender, normal ROM, no scoliosis Musculoskeletal: upper extremities non tender, no obvious deformity, normal ROM throughout, lower extremities non tender, no obvious deformity, normal ROM throughout Extremities: no edema, no cyanosis, no clubbing Pulses: 2+ symmetric, upper and lower extremities, normal cap refill Neurological: alert, oriented x 3, CN2-12 intact, strength normal upper extremities and lower extremities, sensation normal throughout, DTRs 2+ throughout, no cerebellar signs, gait normal Psychiatric: normal affect, behavior normal, pleasant  GU: normal male external genitalia,circumcised, nontender, no masses, no hernia, no lymphadenopathy Rectal: deferred  EKG Indication, dizzy, lightheaded.  Rate 51 bpm, PR 11m, QRS 92mg , QTC, axis 66 degrees, sinus bradycardia, no acute change    Assessment and Plan :   Encounter Diagnoses  Name Primary?  . Encounter for health maintenance examination in adult Yes  . Lightheaded   . Screening for lipid disorders   . Family history of arrhythmia   .  Viral warts, unspecified type   . Need for vaccination     Today you had a preventative care visit or wellness visit.    Topics today may have included healthy lifestyle, diet, exercise, preventative care, vaccinations, sick and well care, proper use of emergency dept and after hours care, as well as other concerns.     Recommendations: Continue to return yearly for your annual wellness and preventative care visits.  This gives a chance to discuss healthy lifestyle, exercise, vaccinations, review your chart record, and perform screenings where appropriate.  I recommend you see your eye doctor yearly for routine vision care.  I recommend you see your dentist yearly for routine dental care including hygiene visits twice yearly.   Vaccination recommendations were reviewed  Counseled on the Td (tetanus, diptheria) vaccine.  Vaccine information sheet given. Td vaccine given after consent obtained.    Screening for cancer: Colon cancer screening:  Age 3yo  Cancer screening You should do a monthly self testicular exam   We discussed PSA, prostate exam, and prostate cancer screening risks/benefits.   Age 45yo  Skin cancer screening: Check your skin regularly for new changes, growing lesions, or other lesions of concern Come in for evaluation  if you have skin lesions of concern.  Lung cancer screening: If you have a greater than 30 pack year history of tobacco use, then you qualify for lung cancer screening with a chest CT scan  We currently don't have screenings for other cancers besides breast, cervical, colon, and lung cancers.  If you have a strong family history of cancer or have other cancer screening concerns, please let me know.    Bone health: Get at least 150 minutes of aerobic exercise weekly Get weight bearing exercise at least once weekly   Heart health: Get at least 150 minutes of aerobic exercise weekly Limit alcohol It is important to maintain a healthy  blood pressure and healthy cholesterol numbers    Separate significant issues discussed: Lightheaded - unclear etiology.  orthostatic vital signs normal . EKG normal.  Pending labs, consider other eval  Wart - advised OTC wart remover liquid vs cryotherapy here vs referral to dermatology.   He will consider options.  Right wrist pain - discussed some stretching and home exercises. F/ u if not improving in 3-4 weeks.   Evan Frey was seen today for annual exam.  Diagnoses and all orders for this visit:  Encounter for health maintenance examination in adult -     Lipid panel -     Comprehensive metabolic panel -     CBC with Differential/Platelet -     EKG 12-Lead  Lightheaded -     CBC with Differential/Platelet -     EKG 12-Lead  Screening for lipid disorders -     Lipid panel  Family history of arrhythmia -     EKG 12-Lead  Viral warts, unspecified type  Need for vaccination -     Td : Tetanus/diphtheria >7yo Preservative  free     Follow-up pending labs, yearly for physical

## 2020-06-02 ENCOUNTER — Encounter: Payer: Self-pay | Admitting: Cardiology

## 2020-06-02 LAB — COMPREHENSIVE METABOLIC PANEL
ALT: 26 IU/L (ref 0–44)
AST: 26 IU/L (ref 0–40)
Albumin: 5 g/dL (ref 4.0–5.0)
Alkaline Phosphatase: 71 IU/L (ref 44–121)
Bilirubin Total: 0.9 mg/dL (ref 0.0–1.2)
CO2: 25 mmol/L (ref 20–29)
Calcium: 9.6 mg/dL (ref 8.7–10.2)
Creatinine, Ser: 1.01 mg/dL (ref 0.76–1.27)
Globulin, Total: 2.4 g/dL (ref 1.5–4.5)
Glucose: 91 mg/dL (ref 65–99)
Potassium: 4 mmol/L (ref 3.5–5.2)
Sodium: 141 mmol/L (ref 134–144)
Total Protein: 7.4 g/dL (ref 6.0–8.5)
eGFR: 95 mL/min/{1.73_m2} (ref >59)

## 2020-06-02 LAB — LIPID PANEL
Chol/HDL Ratio: 3.9 ratio (ref 0.0–5.0)
Cholesterol, Total: 196 mg/dL (ref 100–199)
LDL Chol Calc (NIH): 122 mg/dL — ABNORMAL HIGH (ref 0–99)
Triglycerides: 132 mg/dL (ref 0–149)
VLDL Cholesterol Cal: 24 mg/dL (ref 5–40)

## 2020-06-02 LAB — CBC WITH DIFFERENTIAL/PLATELET
Basophils Absolute: 0 10*3/uL (ref 0.0–0.2)
EOS (ABSOLUTE): 0.1 10*3/uL (ref 0.0–0.4)
Eos: 2 %
Hematocrit: 44.2 % (ref 37.5–51.0)
Immature Granulocytes: 0 %
Lymphs: 36 %
MCH: 30.1 pg (ref 26.6–33.0)
MCHC: 34.8 g/dL (ref 31.5–35.7)
MCV: 86 fL (ref 79–97)
Monocytes Absolute: 0.7 10*3/uL (ref 0.1–0.9)
Monocytes: 9 %
Platelets: 270 10*3/uL (ref 150–450)
RBC: 5.12 x10E6/uL (ref 4.14–5.80)
RDW: 11.9 % (ref 11.6–15.4)
WBC: 7.2 10*3/uL (ref 3.4–10.8)

## 2021-04-06 ENCOUNTER — Encounter: Payer: Self-pay | Admitting: Internal Medicine

## 2021-04-21 ENCOUNTER — Other Ambulatory Visit: Payer: Self-pay

## 2021-04-21 ENCOUNTER — Encounter: Payer: Self-pay | Admitting: Physician Assistant

## 2021-04-21 ENCOUNTER — Ambulatory Visit (INDEPENDENT_AMBULATORY_CARE_PROVIDER_SITE_OTHER): Payer: No Typology Code available for payment source | Admitting: Physician Assistant

## 2021-04-21 DIAGNOSIS — Z1283 Encounter for screening for malignant neoplasm of skin: Secondary | ICD-10-CM

## 2021-04-21 NOTE — Progress Notes (Signed)
° °  New Patient   Subjective  Evan Frey is a 46 y.o. male who presents for the following: Annual Exam (Left collarbone x 1  year- just there" no itch no bleed, right thumb- ? Wart- otc compound w. No personal history of melanoma or non mole skin cancers. ).   The following portions of the chart were reviewed this encounter and updated as appropriate:  Tobacco   Allergies   Meds   Problems   Med Hx   Surg Hx   Fam Hx       Objective  Well appearing patient in no apparent distress; mood and affect are within normal limits.  All skin waist up examined.  waist up No atypical nevi or signs of NMSC noted at the time of the visit.    Assessment & Plan  Screening exam for skin cancer waist up  Screening exams as needed.    No atypical nevi noted at the time of the visit.  I, Malayiah Mcbrayer, PA-C, have reviewed all documentation's for this visit.  The documentation on 04/21/21 for the exam, diagnosis, procedures and orders are all accurate and complete.

## 2021-05-13 ENCOUNTER — Telehealth: Payer: Self-pay | Admitting: Physician Assistant

## 2021-05-13 NOTE — Telephone Encounter (Signed)
KRS patient. Here 05/19/21 and was told an Rx would [facility] but it wasn't done. He doesn't know what the med was supposed to be. Please let him know when this is done.

## 2021-05-25 NOTE — Telephone Encounter (Signed)
Patient called back and he was referring to the [facility] for the wart on his thumb and I spoke with him about picking it up at the office and [facility] Wants [place] to show him how to apply it or she will speak with him tomorrow. ?

## 2021-10-06 ENCOUNTER — Encounter: Payer: Self-pay | Admitting: Medical

## 2021-10-06 ENCOUNTER — Ambulatory Visit (INDEPENDENT_AMBULATORY_CARE_PROVIDER_SITE_OTHER): Payer: No Typology Code available for payment source | Admitting: Medical

## 2021-10-06 VITALS — BP 110/60 | HR 57 | Ht 71.0 in | Wt 177.2 lb

## 2021-10-06 DIAGNOSIS — Z1329 Encounter for screening for other suspected endocrine disorder: Secondary | ICD-10-CM | POA: Diagnosis not present

## 2021-10-06 DIAGNOSIS — Z13220 Encounter for screening for lipoid disorders: Secondary | ICD-10-CM | POA: Diagnosis not present

## 2021-10-06 DIAGNOSIS — Z0000 Encounter for general adult medical examination without abnormal findings: Secondary | ICD-10-CM | POA: Diagnosis not present

## 2021-10-06 DIAGNOSIS — R5383 Other fatigue: Secondary | ICD-10-CM | POA: Diagnosis not present

## 2021-10-06 DIAGNOSIS — H539 Unspecified visual disturbance: Secondary | ICD-10-CM

## 2021-10-06 NOTE — Progress Notes (Signed)
Subjective:   HPI  Evan Frey is a 46 y.o. male who presents for Chief Complaint  Patient presents with   fasting cpe     Fasting cpe, having some panic attacks, has like a floater in eye and seen eye doctor and was told everything is fine but he feels its not    Patient Care Team: Island Dohmen, [name] as PCP - General (Family Medicine) Sees dentist Sees eye doctor  Concerns: Lately having some headaches.  He had some about 20 years ago.  Been occasionally.  Stopped looking at new as he was looking at politics and news a lot until 2 weeks ago.   No other new life stressors  Sometimes feels fatigued.  He and wife go to gym 5am, but sometimes will come back home and take a short nap.  Sometimes has feeling of derealization, feeling like world not real, deja vu feeling.  Is taking multivitamins.    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  Past Medical History:  Diagnosis Date   Allergy    unsure- mild    GERD (gastroesophageal reflux disease)    Low back pain    lumbar, intermittent pain, sees chiropractor from time to time    Past Surgical History:  Procedure Laterality Date   WISDOM TOOTH EXTRACTION      Family History  Problem Relation Age of Onset   Depression Mother    Arrhythmia Mother    Cancer Mother 60       breast, died of mets   Bipolar disorder Mother    Breast cancer Mother    Other Father        healthy, lives in [place]   Arrhythmia Brother    Breast cancer Maternal Aunt    Cancer Paternal Grandmother        type unknown   Heart disease Neg Hx    Diabetes Neg Hx    Stroke Neg Hx    Hypertension Neg Hx    Hyperlipidemia Neg Hx    Colon cancer Neg Hx    Colon polyps Neg Hx    Esophageal cancer Neg Hx    Rectal cancer Neg Hx    Stomach cancer Neg Hx      Current Outpatient Medications:     ibuprofen (ADVIL,MOTRIN) 200 MG tablet, Take 600 mg by mouth every 6 (six) hours as needed., Disp: , Rfl:   No Known Allergies     Review of Systems Constitutional: -fever, -chills, -sweats, -unexpected weight change, -decreased appetite, +fatigue Allergy: -sneezing, -itching, -congestion Dermatology: -changing moles, --rash, -lumps ENT: -runny nose, -ear pain, -sore throat, -hoarseness, -sinus pain, -teeth pain, - ringing in ears, -hearing loss, -nosebleeds Cardiology: -chest pain, -palpitations, -swelling, -difficulty breathing when lying flat, -waking up short of breath Respiratory: -cough, -shortness of breath, -difficulty breathing with exercise or exertion, -wheezing, -coughing up blood Gastroenterology: -abdominal pain, -nausea, -vomiting, -diarrhea, -constipation, -blood in stool, -changes in bowel movement, -difficulty swallowing or eating Hematology: -bleeding, -bruising  Musculoskeletal: -joint aches, -muscle aches, -joint swelling, -back pain, -neck pain, -cramping, -changes in gait Ophthalmology: denies vision changes, eye redness, itching, discharge Urology: -burning with urination, -difficulty urinating, -blood in urine, -urinary frequency, -urgency, -incontinence Neurology: -headache, -weakness, -tingling, -numbness, -memory loss, -falls, -dizziness Psychology: -depressed mood, -agitation, -sleep problems Male GU: no testicular mass, pain, no lymph nodes swollen, no swelling,  no rash.     Objective:  BP 110/60   Pulse (!) 57   Ht 5\' 11"  (1.803 m)   Wt 177 lb 3.2 oz (80.4 kg)   BMI 24.71 kg/m   General appearance: alert, no distress, WD/WN, Caucasian male Skin:no worrisome lesions.  HEENT: normocephalic, conjunctiva/corneas normal, sclerae anicteric, PERRLA, EOMi, nares patent, no discharge or erythema, pharynx normal Oral cavity: MMM, tongue normal, teeth normal Neck: supple, no lymphadenopathy, no thyromegaly, no masses, normal ROM, no bruits Chest: non  tender, normal shape and expansion Heart: RRR, normal S1, S2, no murmurs Lungs: CTA bilaterally, no wheezes, rhonchi, or rales Abdomen: +bs, soft, non tender, non distended, no masses, no hepatomegaly, no splenomegaly, no bruits Back: non tender, normal ROM, no scoliosis Musculoskeletal: upper extremities non tender, no obvious deformity, normal ROM throughout, lower extremities non tender, no obvious deformity, normal ROM throughout Extremities: no edema, no cyanosis, no clubbing Pulses: 2+ symmetric, upper and lower extremities, normal cap refill Neurological: alert, oriented x 3, CN2-12 intact, strength normal upper extremities and lower extremities, sensation normal throughout, DTRs 2+ throughout, no cerebellar signs, gait normal Psychiatric: normal affect, behavior normal, pleasant  GU: normal male external genitalia,circumcised, nontender, no masses, no hernia, no lymphadenopathy Rectal: deferred  Visual fields normal, hand held snellen 20/20 bilat   Assessment and Plan :   Encounter Diagnoses  Name Primary?   Encounter for health maintenance examination in adult Yes   Screening for lipoid disorders    Screening for thyroid disorder    Other fatigue    Visual disturbance     Today you had a preventative care visit or wellness visit.    Topics today may have included healthy lifestyle, diet, exercise, preventative care, vaccinations, sick and well care, proper use of emergency dept and after hours care, as well as other concerns.     Recommendations: Continue to return yearly for your annual wellness and preventative care visits.  This gives a chance to discuss healthy lifestyle, exercise, vaccinations, review your chart record, and perform screenings where appropriate.  I recommend you see your eye doctor yearly for routine vision care.  I recommend you see your dentist yearly for routine dental care including hygiene visits twice yearly.   Vaccination recommendations  were reviewed  Immunization History  Administered Date(s) Administered   Td 06/01/2020   Tdap 06/11/2009     Screening for cancer: Colon cancer screening:  Age 46yo  Cancer screening You should do a monthly self testicular exam   We discussed PSA, prostate exam, and prostate cancer screening risks/benefits.   Age 40yo  Skin cancer screening: Check your skin regularly for new changes, growing lesions, or other lesions of concern Come in for evaluation if you have skin lesions of concern.  Lung cancer screening: If you have a greater than 30 pack year history of tobacco use, then you qualify for lung cancer screening with a chest CT scan  We currently don't have screenings for other cancers besides breast, cervical, colon, and lung cancers.  If you have a strong family history of cancer or have other cancer screening concerns, please let me know.    Bone health: Get at least 150 minutes of aerobic exercise weekly Get weight bearing exercise at least once weekly   Heart health: Get at least 150 minutes of aerobic exercise weekly Limit alcohol It is important to maintain a healthy blood pressure and healthy cholesterol numbers    Separate significant issues discussed: Fatigue -discussed possible  causes, limit exposure to politics and news.   Avoid day time naps.  Continue regular exercise, multivitamins.    Visual disturbance - advised call back to eye doctor to discuss further    Encounter Diagnoses  Name Primary?   Encounter for health maintenance examination in adult Yes   Screening for lipoid disorders    Screening for thyroid disorder    Other fatigue    Visual disturbance      Averi was seen today for fasting cpe .  Diagnoses and all orders for this visit:  Encounter for health maintenance examination in adult -     Lipid panel -     TSH -     Sedimentation rate  Screening for lipoid disorders -     Lipid panel  Screening for thyroid  disorder -     TSH  Other fatigue -     TSH  Visual disturbance -     Lipid panel -     TSH -     Sedimentation rate   Follow-up pending labs, yearly for physical

## 2021-10-07 LAB — LIPID PANEL
Chol/HDL Ratio: 4.2 ratio (ref 0.0–5.0)
Cholesterol, Total: 235 mg/dL — ABNORMAL HIGH (ref 100–199)
HDL: 56 mg/dL (ref >39)
LDL Chol Calc (NIH): 143 mg/dL — ABNORMAL HIGH (ref 0–99)
Triglycerides: 203 mg/dL — ABNORMAL HIGH (ref 0–149)
VLDL Cholesterol Cal: 36 mg/dL (ref 5–40)

## 2021-10-07 LAB — SEDIMENTATION RATE: Sed Rate: 2 mm/hr (ref 0–15)

## 2021-10-07 LAB — TSH: TSH: 1.26 u[IU]/mL (ref 0.450–4.500)

## 2021-11-24 ENCOUNTER — Encounter: Payer: Self-pay | Admitting: Internal Medicine

## 2021-12-28 ENCOUNTER — Encounter: Payer: Self-pay | Admitting: Internal Medicine

## 2022-03-22 ENCOUNTER — Telehealth: Payer: No Typology Code available for payment source | Admitting: Medical

## 2022-03-22 ENCOUNTER — Encounter: Payer: Self-pay | Admitting: Medical

## 2022-03-22 VITALS — HR 80 | Ht 70.0 in | Wt 175.0 lb

## 2022-03-22 DIAGNOSIS — J069 Acute upper respiratory infection, unspecified: Secondary | ICD-10-CM | POA: Diagnosis not present

## 2022-03-22 NOTE — Progress Notes (Signed)
Subjective:     Patient ID: Evan Frey, male   DOB: [birth year], 47 y.o.   MRN: [id number]  This visit type was conducted due to national recommendations for restrictions regarding the COVID-19 Pandemic (e.g. social distancing) in an effort to limit this patient's exposure and mitigate transmission in our community.  Due to their co-morbid illnesses, this patient is at least at moderate risk for complications without adequate follow up.  This format is felt to be most appropriate for this patient at this time.    Documentation for virtual audio and video telecommunications through [place] encounter:  The patient was located at home. The provider was located in the office. The patient did consent to this visit and is aware of possible charges through their insurance for this visit.  The other persons participating in this telemedicine service were none. Time spent on call was 20 minutes and in review of previous records 20 minutes total.  This virtual service is not related to other E/M service within previous 7 days.   HPI Chief Complaint  Patient presents with   other    ST and congestion, cough, no fever, no body aches, started week before Christmas had a fever then then got better, started this past Friday, no covid test, taking Mucinex and claritan   Virtual consult for illness.  He notes sickness week before Christmas, was in bed 5 days, had several days of fever.  Then got better the week of Christmas, started back in the gym, doing fine for a week.  Then over past 4 days got sick again with congestion, cough, sore throat, occasional sneeze, some swollen glands.  No fever, no NVD.  No body aches and chills this week but had chills week before Christmas.   Yesterday morning and night before was the worst.  Today feels 50-60% betters.   Using nothing today but last few days using mucinex, claritin.  He was around his brother last week with similar symptoms.    No other  aggravating or relieving factors. No other complaint.   Past Medical History:  Diagnosis Date   Allergy    unsure- mild    GERD (gastroesophageal reflux disease)    Low back pain    lumbar, intermittent pain, sees chiropractor from time to time   Current Outpatient Medications on File Prior to Visit  Medication Sig Dispense Refill   ibuprofen (ADVIL,MOTRIN) 200 MG tablet Take 600 mg by mouth every 6 (six) hours as needed. (Patient not taking: Reported on 03/22/2022)     No current facility-administered medications on file prior to visit.    Review of Systems As in subjective    Objective:   Physical Exam Due to coronavirus pandemic stay at home measures, patient visit was virtual and they were not examined in person.   Gen: wd, wn, nad Fairly well appearing No labored breathing or wheezing     Assessment:     Encounter Diagnosis  Name Primary?   Upper respiratory tract infection, unspecified type Yes       Plan:     We discussed symptoms and concerns.  Based on his recent symptoms it sounds like he probably had influenza ever week before Christmas, got better for a week but was likely exposed to other illnesses and contracted more of a viral cold URI illness now.  He was worse the last 3 days and is now on day 4 so likely moving on into resolution of symptoms  Continue to hydrate  well, salt water gargles, Tylenol or ibuprofen for fever or not feeling well, consider some of the counter vitamins such as emergenC, rest and if symptoms or not improving over the next few days then let us know.  Offered testing in our back parking lot for strep and COVID if desired.  For now he is going to use a watch and wait approach as he does feel much better today compared to the last 3 days.  Hersey was seen today for other.  Diagnoses and all orders for this visit:  Upper respiratory tract infection, unspecified type    F/u prn

## 2022-03-31 ENCOUNTER — Telehealth: Payer: Self-pay | Admitting: Medical

## 2022-03-31 ENCOUNTER — Other Ambulatory Visit: Payer: Self-pay | Admitting: Medical

## 2022-03-31 MED ORDER — AZITHROMYCIN 250 MG PO TABS: ORAL_TABLET | ORAL | 0 refills | Status: DC

## 2022-03-31 MED ORDER — HYDROCOD POLI-CHLORPHE POLI ER 10-8 MG/5ML PO SUER: 5.0000 mL | Freq: Two times a day (BID) | ORAL | 0 refills | Status: DC

## 2022-03-31 NOTE — Telephone Encounter (Signed)
Pt's wife called and states that pt is not any better. She states that pt stills has productive cough and he is coughing nonstop. She thinks it may be time for an antibiotic due to the fact that he is just not any better. [facility] and can be reached at [phone].

## 2022-10-11 ENCOUNTER — Ambulatory Visit (INDEPENDENT_AMBULATORY_CARE_PROVIDER_SITE_OTHER): Payer: No Typology Code available for payment source | Admitting: Medical

## 2022-10-11 ENCOUNTER — Encounter: Payer: Self-pay | Admitting: Medical

## 2022-10-11 VITALS — BP 124/70 | HR 60 | Ht 70.5 in | Wt 152.2 lb

## 2022-10-11 DIAGNOSIS — Z0000 Encounter for general adult medical examination without abnormal findings: Secondary | ICD-10-CM | POA: Diagnosis not present

## 2022-10-11 DIAGNOSIS — Z1211 Encounter for screening for malignant neoplasm of colon: Secondary | ICD-10-CM | POA: Diagnosis not present

## 2022-10-11 DIAGNOSIS — Z13220 Encounter for screening for lipoid disorders: Secondary | ICD-10-CM

## 2022-10-11 DIAGNOSIS — M25511 Pain in right shoulder: Secondary | ICD-10-CM

## 2022-10-11 LAB — COMPREHENSIVE METABOLIC PANEL: Sodium: 142 mmol/L (ref 134–144)

## 2022-10-11 LAB — LIPID PANEL

## 2022-10-11 LAB — CBC
Hemoglobin: 14.5 g/dL (ref 13.0–17.7)
MCHC: 33.9 g/dL (ref 31.5–35.7)
RDW: 12.2 % (ref 11.6–15.4)

## 2022-10-11 LAB — POCT URINALYSIS DIP (PROADVANTAGE DEVICE)
Bilirubin, UA: NEGATIVE
Blood, UA: NEGATIVE
Glucose, UA: NEGATIVE mg/dL
Ketones, POC UA: NEGATIVE mg/dL
Leukocytes, UA: NEGATIVE
Nitrite, UA: NEGATIVE
Protein Ur, POC: NEGATIVE mg/dL
Specific Gravity, Urine: 1.01
Urobilinogen, Ur: NEGATIVE
pH, UA: 6 (ref 5.0–8.0)

## 2022-10-11 LAB — VITAMIN D 25 HYDROXY (VIT D DEFICIENCY, FRACTURES)

## 2022-10-11 NOTE — Progress Notes (Signed)
Subjective:   HPI  Evan Frey is a 47 y.o. male who presents for Chief Complaint  Patient presents with   Annual Exam    Fasting cpe, needs referral for colonoscopy, right shoulder x February,     Patient Care Team: Teona Vargus, [name] as PCP - General (Family Medicine) Sees dentist Sees eye doctor  Concerns: Having some right shoulder pain.  Been hurting for several weeks, likely injured doing pull-ups.  He is seen massage therapist and chiropractor who felt like he has an impingement issue.  Still not getting better.  He knows he is due for colonoscopy.  Cut out alcohol since a year ago, eating more protein, has lost weight in past year.   Reviewed their medical, surgical, family, social, medication, and allergy history and updated chart as appropriate.  Past Medical History:  Diagnosis Date   Allergy    unsure- mild    GERD (gastroesophageal reflux disease)    Low back pain    lumbar, intermittent pain, sees chiropractor from time to time    Past Surgical History:  Procedure Laterality Date   WISDOM TOOTH EXTRACTION      Family History  Problem Relation Age of Onset   Depression Mother    Arrhythmia Mother    Cancer Mother 96       breast, died of mets   Bipolar disorder Mother    Breast cancer Mother    Other Father        healthy, lives in [place]   Arrhythmia Brother    Breast cancer Maternal Aunt    Cancer Paternal Grandmother        type unknown   Heart disease Neg Hx    Diabetes Neg Hx    Stroke Neg Hx    Hypertension Neg Hx    Hyperlipidemia Neg Hx    Colon cancer Neg Hx    Colon polyps Neg Hx    Esophageal cancer Neg Hx    Rectal cancer Neg Hx    Stomach cancer Neg Hx     No current outpatient medications on file.  No Known Allergies    Review of Systems  Constitutional:  Negative for chills, fever, malaise/fatigue and weight loss.  HENT:  Negative for congestion, ear pain, hearing loss, sore throat and tinnitus.   Eyes:   Negative for blurred vision, pain and redness.  Respiratory:  Negative for cough, hemoptysis and shortness of breath.   Cardiovascular:  Negative for chest pain, palpitations, orthopnea, claudication and leg swelling.  Gastrointestinal:  Negative for abdominal pain, blood in stool, constipation, diarrhea, nausea and vomiting.  Genitourinary:  Negative for dysuria, flank pain, frequency, hematuria and urgency.  Musculoskeletal:  Positive for joint pain. Negative for falls and myalgias.  Skin:  Negative for itching and rash.  Neurological:  Negative for dizziness, tingling, speech change, weakness and headaches.  Endo/Heme/Allergies:  Negative for polydipsia. Does not bruise/bleed easily.  Psychiatric/Behavioral:  Negative for depression and memory loss. The patient is not nervous/anxious and does not have insomnia.        Objective:  BP 124/70   Pulse 60   Ht 5' 10.5" (1.791 m)   Wt 152 lb 3.2 oz (69 kg)   BMI 21.53 kg/m   Wt Readings from Last 3 Encounters:  10/11/22 152 lb 3.2 oz (69 kg)  03/22/22 175 lb (79.4 kg)  10/06/21 177 lb 3.2 oz (80.4 kg)    General appearance: alert, no distress, WD/WN, Caucasian male Skin:no  worrisome lesions.  HEENT: normocephalic, conjunctiva/corneas normal, sclerae anicteric, PERRLA, EOMi, nares patent, no discharge or erythema, pharynx normal Oral cavity: MMM, tongue normal, teeth normal Neck: supple, no lymphadenopathy, no thyromegaly, no masses, normal ROM, no bruits Chest: non tender, normal shape and expansion Heart: RRR, normal S1, S2, no murmurs Lungs: CTA bilaterally, no wheezes, rhonchi, or rales Abdomen: +bs, soft, non tender, non distended, no masses, no hepatomegaly, no splenomegaly, no bruits Back: non tender, normal ROM, no scoliosis Musculoskeletal: Mild tenderness over right biceps origin, otherwise shoulder nontender, range of motion relatively normal but there is some weakness with step-off test with internal range of motion, no  obvious laxity, negative rotator cuff test, there is some tenderness with impingement test.  Otherwise right arm and rest the left arm is normal.  Legs normal ROM throughout, lower extremities non tender, no obvious deformity, normal ROM throughout Extremities: no edema, no cyanosis, no clubbing Pulses: 2+ symmetric, upper and lower extremities, normal cap refill Neurological: alert, oriented x 3, CN2-12 intact, strength normal upper extremities and lower extremities, sensation normal throughout, DTRs 2+ throughout, no cerebellar signs, gait normal Psychiatric: normal affect, behavior normal, pleasant  GU: normal male external genitalia,circumcised, nontender, no masses, no hernia, no lymphadenopathy Rectal: deferred    Assessment and Plan :   Encounter Diagnoses  Name Primary?   Encounter for health maintenance examination in adult Yes   Screening for lipid disorders    Screen for colon cancer    Right shoulder pain, unspecified chronicity     Today you had a preventative care visit or wellness visit.    Topics today may have included healthy lifestyle, diet, exercise, preventative care, vaccinations, sick and well care, proper use of emergency dept and after hours care, as well as other concerns.     Recommendations: Continue to return yearly for your annual wellness and preventative care visits.  This gives [place] a chance to discuss healthy lifestyle, exercise, vaccinations, review your chart record, and perform screenings where appropriate.  I recommend you see your eye doctor yearly for routine vision care.  I recommend you see your dentist yearly for routine dental care including hygiene visits twice yearly.   Vaccination recommendations were reviewed  Immunization History  Administered Date(s) Administered   Td 06/01/2020   Tdap 06/11/2009     Screening for cancer: Colon cancer screening:  Age 46yo.  We discussed Cologuard versus colonoscopy pros and cons.  He will think  about both and let me know which when he wants to do.  Cancer screening You should do a monthly self testicular exam   We discussed PSA, prostate exam, and prostate cancer screening risks/benefits.   Age 81yo  Skin cancer screening: Check your skin regularly for new changes, growing lesions, or other lesions of concern Come in for evaluation if you have skin lesions of concern.  Lung cancer screening: If you have a greater than 30 pack year history of tobacco use, then you qualify for lung cancer screening with a chest CT scan  We currently don't have screenings for other cancers besides breast, cervical, colon, and lung cancers.  If you have a strong family history of cancer or have other cancer screening concerns, please let me know.    Bone health: Get at least 150 minutes of aerobic exercise weekly Get weight bearing exercise at least once weekly   Heart health: Get at least 150 minutes of aerobic exercise weekly Limit alcohol It is important to maintain a healthy blood  pressure and healthy cholesterol numbers    Separate significant issues discussed: Right shoulder pain-we discussed potential issues which could include impingement issue and some weakness of the rotator cuff particular infraspinatus.  We discussed a few rehab exercises he can do and avoid reinjury.  He will let me know when he is ready for referral to orthopedist.    Javione was seen today for annual exam.  Diagnoses and all orders for this visit:  Encounter for health maintenance examination in adult -     Comprehensive metabolic panel -     CBC -     Lipid panel -     VITAMIN D 25 Hydroxy (Vit-D Deficiency, Fractures) -     POCT Urinalysis DIP (Proadvantage Device)  Screening for lipid disorders -     Lipid panel  Screen for colon cancer  Right shoulder pain, unspecified chronicity   Follow-up pending labs, yearly for physical

## 2022-10-11 NOTE — Patient Instructions (Addendum)
Dr. Jeani Hawking and Dr. Charna Elizabeth Address: [address], [place], [place] [address] Phone: [phone]   [facility] Address: [address] [place], [place] [address] Phone: [phone]   [facility] Address: [address], [place], [place] [address] Phone: [phone]

## 2022-10-12 LAB — LIPID PANEL
Cholesterol, Total: 188 mg/dL (ref 100–199)
HDL: 55 mg/dL (ref >39)
Triglycerides: 90 mg/dL (ref 0–149)

## 2022-10-12 LAB — COMPREHENSIVE METABOLIC PANEL
Alkaline Phosphatase: 71 IU/L (ref 44–121)
BUN/Creatinine Ratio: 17 (ref 9–20)
BUN: 20 mg/dL (ref 6–24)
Bilirubin Total: 1.1 mg/dL (ref 0.0–1.2)
CO2: 24 mmol/L (ref 20–29)
Calcium: 9.4 mg/dL (ref 8.7–10.2)
Chloride: 104 mmol/L (ref 96–106)
Potassium: 4.4 mmol/L (ref 3.5–5.2)

## 2022-10-12 LAB — CBC
Hematocrit: 42.8 % (ref 37.5–51.0)
MCH: 30 pg (ref 26.6–33.0)
MCV: 89 fL (ref 79–97)
Platelets: 263 10*3/uL (ref 150–450)
RBC: 4.83 x10E6/uL (ref 4.14–5.80)
WBC: 6.3 10*3/uL (ref 3.4–10.8)

## 2022-10-12 NOTE — Progress Notes (Signed)
Results sent through MyChart

## 2023-01-16 ENCOUNTER — Other Ambulatory Visit: Payer: Self-pay | Admitting: Medical

## 2023-01-16 ENCOUNTER — Telehealth: Payer: Self-pay | Admitting: Medical

## 2023-01-16 DIAGNOSIS — M25511 Pain in right shoulder: Secondary | ICD-10-CM

## 2023-01-16 NOTE — Telephone Encounter (Signed)
Pt wife called and states that pt is still having shoulder pain trouble,  She wants to know if pt could get the referral for a otho dr  Pt can be reached at [phone]

## 2023-01-17 NOTE — Telephone Encounter (Signed)
Pt was notified.  

## 2023-06-26 ENCOUNTER — Other Ambulatory Visit: Payer: Self-pay | Admitting: Medical

## 2023-06-26 ENCOUNTER — Telehealth: Payer: Self-pay | Admitting: Medical

## 2023-06-26 DIAGNOSIS — Z1211 Encounter for screening for malignant neoplasm of colon: Secondary | ICD-10-CM

## 2023-06-26 NOTE — Telephone Encounter (Signed)
 Copied from CRM [id number]. Topic: Referral - Request for Referral >> Jun 26, 2023  9:56 AM Nyra Capes wrote: Patient wife calling in patient is asking for a referral for colonoscopy patient has not had one done, requesting due to age.    Did the patient discuss referral with their provider in the last year? No (If No - schedule appointment) (If Yes - send message)  Appointment offered? No  Type of order/referral and detailed reason for visit: Colonoscopy   Preference of office, provider, location: Dr Lavon Paganini with [facility]    If referral order, have you been seen by this specialty before?No  (If Yes, this issue or another issue? When? Where?  Can we respond through MyChart? Yes

## 2023-07-10 ENCOUNTER — Telehealth: Payer: Self-pay | Admitting: Internal Medicine

## 2023-07-10 NOTE — Telephone Encounter (Signed)
 Patient is calling to follow-up on Referral Status for GI. Please Notify pt about referral  Copied from CRM [id number]. Topic: Referral - Status >> Jul 10, 2023  9:36 AM Clydene Darner H wrote: Reason for CRM: Patient wife called this morning regarding referral status for colonoscopy. She stated she called a couple weeks ago and haven't heard anything back I informed her the status of the referral is waiting for a prior auth.   Callback Number - Wife [phone] -Patient [phone]

## 2023-07-20 NOTE — Telephone Encounter (Signed)
 Someone called him on 04/29 and left a VM for him to call back.  I went ahead and sent him a MyChart message with info for him to call to schedule.

## 2023-07-26 ENCOUNTER — Encounter: Payer: Self-pay | Admitting: Internal Medicine

## 2023-09-18 ENCOUNTER — Ambulatory Visit (AMBULATORY_SURGERY_CENTER)

## 2023-09-18 ENCOUNTER — Other Ambulatory Visit: Payer: Self-pay

## 2023-09-18 ENCOUNTER — Encounter: Payer: Self-pay | Admitting: Internal Medicine

## 2023-09-18 VITALS — Ht 70.5 in | Wt 165.0 lb

## 2023-09-18 DIAGNOSIS — Z1211 Encounter for screening for malignant neoplasm of colon: Secondary | ICD-10-CM

## 2023-09-18 MED ORDER — NA SULFATE-K SULFATE-MG SULF 17.5-3.13-1.6 GM/177ML PO SOLN: 1.0000 | Freq: Once | ORAL | 0 refills | Status: AC

## 2023-09-18 NOTE — Progress Notes (Signed)
 Denies allergies to eggs or soy products. Denies complication of anesthesia or sedation. Denies use of weight loss medication. Denies use of O2.   Emmi instructions given for colonoscopy.

## 2023-10-05 ENCOUNTER — Encounter: Payer: Self-pay | Admitting: Internal Medicine

## 2023-10-11 ENCOUNTER — Encounter: Payer: Self-pay | Admitting: Internal Medicine

## 2023-10-11 ENCOUNTER — Ambulatory Visit (AMBULATORY_SURGERY_CENTER): Payer: Self-pay | Admitting: Internal Medicine

## 2023-10-11 VITALS — BP 101/64 | HR 67 | Temp 98.1°F | Resp 18 | Ht 70.5 in | Wt 165.0 lb

## 2023-10-11 DIAGNOSIS — Z1211 Encounter for screening for malignant neoplasm of colon: Secondary | ICD-10-CM | POA: Diagnosis present

## 2023-10-11 MED ORDER — SODIUM CHLORIDE 0.9 % IV SOLN: 500.0000 mL | Freq: Once | INTRAVENOUS | Status: DC

## 2023-10-11 NOTE — Progress Notes (Signed)
 Sedate, gd SR, tolerated procedure well, VSS, report to RN

## 2023-10-11 NOTE — Patient Instructions (Addendum)
 Colonoscopy was normal - no polyps or cancer were seen. Prostate was also normal.  Next routine colonoscopy or other screening test in 10 years - 2035.  I appreciate the opportunity to care for you. [name], MD, FACG    YOU HAD AN ENDOSCOPIC PROCEDURE TODAY AT THE [facility]:   Refer to the procedure report that was given to you for any specific questions about what was found during the examination.  If the procedure report does not answer your questions, please call your gastroenterologist to clarify.  If you requested that your care partner not be given the details of your procedure findings, then the procedure report has been included in a sealed envelope for you to review at your convenience later.  YOU SHOULD EXPECT: Some feelings of bloating in the abdomen. Passage of more gas than usual.  Walking can help get rid of the air that was put into your GI tract during the procedure and reduce the bloating. If you had a lower endoscopy (such as a colonoscopy or flexible sigmoidoscopy) you may notice spotting of blood in your stool or on the toilet paper. If you underwent a bowel prep for your procedure, you may not have a normal bowel movement for a few days.  Please Note:  You might notice some irritation and congestion in your nose or some drainage.  This is from the oxygen used during your procedure.  There is no need for concern and it should clear up in a day or so.  SYMPTOMS TO REPORT IMMEDIATELY:  Following lower endoscopy (colonoscopy or flexible sigmoidoscopy):  Excessive amounts of blood in the stool  Significant tenderness or worsening of abdominal pains  Swelling of the abdomen that is new, acute  Fever of 100F or higher   For urgent or emergent issues, a gastroenterologist can be reached at any hour by calling [phone]. Do not use MyChart messaging for urgent concerns.    DIET:  We do recommend a small meal at first, but then you may proceed to  your regular diet.  Drink plenty of fluids but you should avoid alcoholic beverages for 24 hours.  ACTIVITY:  You should plan to take it easy for the rest of today and you should NOT DRIVE or use heavy machinery until tomorrow (because of the sedation medicines used during the test).    FOLLOW UP: Our staff will call the number listed on your records the next business day following your procedure.  We will call around 7:15- 8:00 am to check on you and address any questions or concerns that you may have regarding the information given to you following your procedure. If we do not reach you, we will leave a message.      SIGNATURES/CONFIDENTIALITY: You and/or your care partner have signed paperwork which will be entered into your electronic medical record.  These signatures attest to the fact that that the information above on your After Visit Summary has been reviewed and is understood.  Full responsibility of the confidentiality of this discharge information lies with you and/or your care-partner.

## 2023-10-11 NOTE — Progress Notes (Signed)
 [facility] History and Physical   Primary Care Physician:  Bulah Alm RAMAN, PA-C   Reason for Procedure:    Encounter Diagnosis  Name Primary?   Special screening for malignant neoplasms, colon Yes     Plan:    colonoscopy     HPI: Evan Frey is a 48 y.o. male here for a screening colonoscopy   Past Medical History:  Diagnosis Date   Allergy     unsure- mild    GERD (gastroesophageal reflux disease)    Low back pain    lumbar, intermittent pain, sees chiropractor from time to time    Past Surgical History:  Procedure Laterality Date   WISDOM TOOTH EXTRACTION      Prior to Admission medications   Medication Sig Start Date End Date Taking? Authorizing Provider  acetaminophen  (TYLENOL ) 325 MG tablet Take 650 mg by mouth every 6 (six) hours as needed.   Yes [provider]  ibuprofen (ADVIL) 200 MG tablet Take 200 mg by mouth every 6 (six) hours as needed.   Yes [provider]  Multiple Vitamin (MULTIVITAMIN) tablet Take 1 tablet by mouth daily.    [provider]  Wheat Dextrin (BENEFIBER DRINK MIX PO) Take 1 Scoop by mouth daily.    [provider]    Current Outpatient Medications  Medication Sig Dispense Refill   acetaminophen  (TYLENOL ) 325 MG tablet Take 650 mg by mouth every 6 (six) hours as needed.     ibuprofen (ADVIL) 200 MG tablet Take 200 mg by mouth every 6 (six) hours as needed.     Multiple Vitamin (MULTIVITAMIN) tablet Take 1 tablet by mouth daily.     Wheat Dextrin (BENEFIBER DRINK MIX PO) Take 1 Scoop by mouth daily.     Current Facility-Administered Medications  Medication Dose Route Frequency Provider Last Rate Last Admin   0.9 %  sodium chloride  infusion  500 mL Intravenous Once Avram Lupita BRAVO, MD        Allergies as of 10/11/2023   (No Known Allergies)    Family History  Problem Relation Age of Onset   Depression Mother    Arrhythmia Mother    Cancer Mother 31       breast, died  of mets   Bipolar disorder Mother    Breast cancer Mother    Other Father        healthy, lives in [place]    Arrhythmia Brother    Breast cancer Maternal Aunt    Cancer Paternal Grandmother        type unknown   Heart disease Neg Hx    Diabetes Neg Hx    Stroke Neg Hx    Hypertension Neg Hx    Hyperlipidemia Neg Hx    Colon cancer Neg Hx    Colon polyps Neg Hx    Esophageal cancer Neg Hx    Rectal cancer Neg Hx    Stomach cancer Neg Hx     Social History   Socioeconomic History   Marital status: Married    Spouse name: Not on file   Number of children: 0   Years of education: Not on file   Highest education level: Not on file  Occupational History   Occupation: IT    Employer: [employer]  Tobacco Use   Smoking status: Former    Current packs/day: 0.00    Types: Cigarettes    Quit date: 03/21/2006    Years since quitting: 17.5   Smokeless tobacco: Never  Substance and Sexual Activity   Alcohol use: Not Currently    Alcohol/week: 10.0 standard drinks of alcohol    Types: 10 Cans of beer per week    Comment: intermittent, mix of things   Drug use: No   Sexual activity: Not on file  Other Topics Concern   Not on file  Social History Narrative   Lives with wife, 2 chihuahuas, regular exercise most days per week,, at least 5 days per week, running, mountain biking, weight training.   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  09/2021   Social Drivers of Health   Financial Resource Strain: Low Risk  (10/11/2022)   Overall Financial Resource Strain (CARDIA)    Difficulty of Paying Living Expenses: Not very hard  Food Insecurity: No Food Insecurity (10/11/2022)   Hunger Vital Sign    Worried About Running Out of Food in the Last Year: Never true    Ran Out of Food in the Last Year: Never true  Transportation Needs: No Transportation Needs (10/11/2022)   PRAPARE - Administrator, Civil Service (Medical): No    Lack of Transportation (Non-Medical): No  Physical Activity:  Sufficiently Active (10/11/2022)   Exercise Vital Sign    Days of Exercise per Week: 4 days    Minutes of Exercise per Session: 120 min  Stress: No Stress Concern Present (10/11/2022)   [employer] of Occupational Health - Occupational Stress Questionnaire    Feeling of Stress : Not at all  Social Connections: Moderately Integrated (10/11/2022)   Social Connection and Isolation Panel    Frequency of Communication with Friends and Family: Three times a week    Frequency of Social Gatherings with Friends and Family: Twice a week    Attends Religious Services: Never    Database administrator or Organizations: Yes    Attends Banker Meetings: 1 to 4 times per year    Marital Status: Married  Catering manager Violence: Not At Risk (10/11/2022)   Humiliation, Afraid, Rape, and Kick questionnaire    Fear of Current or Ex-Partner: No    Emotionally Abused: No    Physically Abused: No    Sexually Abused: No    Review of Systems:  All other review of systems negative except as mentioned in the HPI.  Physical Exam: Vital signs BP 117/75   Pulse 68   Temp 98.1 F (36.7 C)   Ht 5' 10.5 (1.791 m)   Wt 165 lb (74.8 kg)   SpO2 99%   BMI 23.34 kg/m   General:   Alert,  Well-developed, well-nourished, pleasant and cooperative in NAD Lungs:  Clear throughout to auscultation.   Heart:  Regular rate and rhythm; no murmurs, clicks, rubs,  or gallops. Abdomen:  Soft, nontender and nondistended. Normal bowel sounds.   Neuro/Psych:  Alert and cooperative. Normal mood and affect. A and O x 3   @Yitzchok Carriger  CHARLENA Commander, MD, [facility] [phone] (pager) 10/11/2023 10:59 AM@

## 2023-10-11 NOTE — Op Note (Signed)
 [facility] Patient Name: Evan Frey Procedure Date: 10/11/2023 11:02 AM MRN: [id number] Endoscopist: Lupita FORBES Commander , MD, [id number] Age: 48 Referring MD:  Date of Birth: 1975-12-12 Gender: Male Account #: [id number] Procedure:                Colonoscopy Indications:              Screening for colorectal malignant neoplasm, This                            is the patient's first colonoscopy Medicines:                Monitored Anesthesia Care Procedure:                Pre-Anesthesia Assessment:                           - Prior to the procedure, a History and Physical                            was performed, and patient medications and                            allergies were reviewed. The patient's tolerance of                            previous anesthesia was also reviewed. The risks                            and benefits of the procedure and the sedation                            options and risks were discussed with the patient.                            All questions were answered, and informed consent                            was obtained. Prior Anticoagulants: The patient has                            taken no anticoagulant or antiplatelet agents. ASA                            Grade Assessment: II - A patient with mild systemic                            disease. After reviewing the risks and benefits,                            the patient was deemed in satisfactory condition to                            undergo the procedure.  After obtaining informed consent, the colonoscope                            was passed under direct vision. Throughout the                            procedure, the patient's blood pressure, pulse, and                            oxygen saturations were monitored continuously. The                            Olympus Scope [id number] was introduced through the                            anus and advanced to  the the cecum, identified by                            appendiceal orifice and ileocecal valve. The                            colonoscopy was performed without difficulty. The                            patient tolerated the procedure well. The quality                            of the bowel preparation was excellent. The                            ileocecal valve, appendiceal orifice, and rectum                            were photographed. The bowel preparation used was                            SUPREP via split dose instruction. Scope In: 11:11:11 AM Scope Out: 11:27:03 AM Scope Withdrawal Time: 0 hours 11 minutes 5 seconds  Total Procedure Duration: 0 hours 15 minutes 52 seconds  Findings:                 The perianal and digital rectal examinations were                            normal. Pertinent negatives include normal prostate                            (size, shape, and consistency).                           The entire examined colon appeared normal on direct                            and retroflexion views. Complications:  No immediate complications. Estimated Blood Loss:     Estimated blood loss: none. Impression:               - The entire examined colon is normal on direct and                            retroflexion views.                           - No specimens collected. Recommendation:           - Patient has a contact number available for                            emergencies. The signs and symptoms of potential                            delayed complications were discussed with the                            patient. Return to normal activities tomorrow.                            Written discharge instructions were provided to the                            patient.                           - Resume previous diet.                           - Continue present medications.                           - Repeat colonoscopy in 10 years for screening                             purposes. Lupita FORBES Commander, MD 10/11/2023 11:30:17 AM This report has been signed electronically.

## 2023-10-12 ENCOUNTER — Telehealth: Payer: Self-pay | Admitting: *Deleted

## 2023-10-12 NOTE — Telephone Encounter (Signed)
  Follow up Call-     10/11/2023   10:02 AM  Call back number  Post procedure Call Back phone  # [phone]  Permission to leave phone message Yes     Patient questions:  Do you have a fever, pain , or abdominal swelling? No. Pain Score  0 *  Have you tolerated food without any problems? Yes.    Have you been able to return to your normal activities? Yes.    Do you have any questions about your discharge instructions: Diet   No. Medications  No. Follow up visit  No.  Do you have questions or concerns about your Care? No.  Actions: * If pain score is 4 or above: No action needed, pain <4.
